# Patient Record
Sex: Female | Born: 1965 | Race: White | Hispanic: No | Marital: Married | Smoking: Former smoker
Health system: Southern US, Community
[De-identification: ages and names within clinical notes are randomized; demographics above are authoritative.]

## PROBLEM LIST (undated history)

## (undated) DIAGNOSIS — I10 Essential (primary) hypertension: Secondary | ICD-10-CM

## (undated) DIAGNOSIS — Z9981 Dependence on supplemental oxygen: Secondary | ICD-10-CM

## (undated) DIAGNOSIS — J439 Emphysema, unspecified: Secondary | ICD-10-CM

## (undated) HISTORY — PX: NASAL SEPTUM SURGERY: SHX37

## (undated) HISTORY — PX: TUBAL LIGATION: SHX77

## (undated) HISTORY — DX: Essential (primary) hypertension: I10

## (undated) HISTORY — PX: BLADDER SUSPENSION: SHX72

## (undated) HISTORY — PX: LUNG LOBECTOMY: SHX167

## (undated) HISTORY — PX: OTHER SURGICAL HISTORY: SHX169

## (undated) HISTORY — DX: Dependence on supplemental oxygen: Z99.81

## (undated) HISTORY — DX: Emphysema, unspecified: J43.9

---

## 2007-07-12 ENCOUNTER — Ambulatory Visit: Payer: Self-pay | Admitting: Internal Medicine

## 2007-07-12 DIAGNOSIS — E669 Obesity, unspecified: Secondary | ICD-10-CM

## 2007-07-12 DIAGNOSIS — J449 Chronic obstructive pulmonary disease, unspecified: Secondary | ICD-10-CM

## 2007-07-12 HISTORY — DX: Obesity, unspecified: E66.9

## 2007-07-12 HISTORY — DX: Chronic obstructive pulmonary disease, unspecified: J44.9

## 2007-07-19 ENCOUNTER — Telehealth (INDEPENDENT_AMBULATORY_CARE_PROVIDER_SITE_OTHER): Payer: Self-pay | Admitting: *Deleted

## 2007-08-10 ENCOUNTER — Ambulatory Visit: Payer: Self-pay | Admitting: Internal Medicine

## 2007-08-26 ENCOUNTER — Encounter: Payer: Self-pay | Admitting: Internal Medicine

## 2007-09-13 ENCOUNTER — Ambulatory Visit: Payer: Self-pay | Admitting: Cardiology

## 2007-09-14 ENCOUNTER — Ambulatory Visit: Payer: Self-pay

## 2007-09-14 ENCOUNTER — Encounter: Payer: Self-pay | Admitting: Cardiology

## 2007-09-21 ENCOUNTER — Ambulatory Visit: Payer: Self-pay | Admitting: Internal Medicine

## 2010-02-14 ENCOUNTER — Emergency Department (HOSPITAL_COMMUNITY): Admission: EM | Admit: 2010-02-14 | Discharge: 2010-02-14 | Payer: Self-pay | Admitting: Emergency Medicine

## 2010-08-21 LAB — URINALYSIS, ROUTINE W REFLEX MICROSCOPIC
Bilirubin Urine: NEGATIVE
Glucose, UA: NEGATIVE mg/dL
Hgb urine dipstick: NEGATIVE
Ketones, ur: NEGATIVE mg/dL
Nitrite: NEGATIVE
Protein, ur: NEGATIVE mg/dL
Specific Gravity, Urine: 1.004 — ABNORMAL LOW (ref 1.005–1.030)
Urobilinogen, UA: 0.2 mg/dL (ref 0.0–1.0)
pH: 7 (ref 5.0–8.0)

## 2010-10-21 NOTE — Assessment & Plan Note (Signed)
[facility]                            CARDIOLOGY OFFICE NOTE   TAMELLA, TUCCILLO                        MRN:          [id number]  DATE:09/13/2007                            DOB:          Apr 18, 1966    PRIMARY CARE PHYSICIAN:  Dr. Blane Ohara, [facility].   REASON FOR PRESENTATION:  Evaluate patient with abnormal echocardiogram  and dyspnea.   HISTORY OF PRESENT ILLNESS:  The patient is a pleasant, unfortunate 45-  year-old with apparently severe COPD.  She was followed by a  pulmonologist in [place].  More recently she has been seen at [facility].  Her lung disease is severe enough that they wanted to list her  transplant, but she has not yet consented to this.  She says they are  planning on doing a lung reduction surgery.  I do not have any of  these notes.  She is on O2 p.r.n., though she is supposed to wear it  more frequently.  She is dyspneic with mild exertion.  She recently had  an echocardiogram and was told this was abnormal and it was suggested  that she follow up with me.   The patient has not had any prior cardiac history.  She did have chest  pain and reports an exercise Cardiolite 2 years ago which she reports  was normal.  She has had EKGs to otherwise no other abnormalities noted.  She still gets occasional chest discomfort.  This has happened three  times in the last 4 months.  When it happens it is a discomfort that is  sharp and under her left breast.  It increases and decreases with  inspiration.  It radiates to her arm and lasts continuously for 2-3 days  before going away.  It goes away spontaneously.  She does not have any  associated nausea, vomiting or diaphoresis.  It occurs without  provocation.   PAST MEDICAL HISTORY:  1. Diabetes mellitus, recently diagnosed and thought to be related to      her frequent prednisone use.  2. Hyperlipidemia x1 month.   PAST SURGICAL HISTORY:  Tubal ligation.   ALLERGIES/INTOLERANCES:  None.   MEDICATIONS:  Alprazolam, Symbicort, Crestor 10 mg daily, Astelin,  Nasonex.   SOCIAL HISTORY:  The patient is a housewife.  She is married.  She has  six children with two sets of twins.  She quit smoking after about 20  years.  She has not smoked since March 2002.   FAMILY HISTORY:  Contributory for her father having severe emphysema and  her brother dying with emphysema.  She has two sisters with arrhythmias  but she does not know more details about this.  She has another sister  who sounds like she has a bicuspid aortic valve.  Her father did have an  MI in his 25s apparently.   REVIEW OF SYSTEMS:  As stated in the HPI and is positive headaches and  dizziness, reflux, swelling in her feet and ankles.  Negative for other  systems.   PHYSICAL EXAMINATION:  The patient is in no acute  distress.  Blood pressure 114/82, heart rate 80 and regular, weight 173 pounds.  HEENT:  Eyelids unremarkable.  Pupils equal, round and react to light,  fundi not visualized, oral mucosa unremarkable.  NECK:  No jugular venous distention at 45 degrees.  Carotid upstroke  brisk and symmetric.  No bruits, no thyromegaly.  LYMPHATICS:  No cervical, axillary or inguinal adenopathy.  LUNGS:  Decreased breath sounds without wheezing or crackles, no  dullness to percussion.  BACK:  No costovertebral angle mass.  CHEST:  Unremarkable.  HEART:  PMI not displaced or sustained, distant heart sounds, S1 and S2  within normal.  No S3, no S4, no clicks, no rubs, no murmurs.  ABDOMEN:  Flat, positive bowel sounds, normal in frequency and pitch, no  bruits, no rebound, no guarding, no midline pulsatile mass.  No  hepatomegaly, no splenomegaly.  SKIN:  No rashes, no nodules.  EXTREMITIES:  2+ pulse throughout, no edema, no cyanosis, no clubbing.  NEUROLOGIC:  Oriented to person place and time.  Cranial nerves II-XII  grossly intact.  Motor grossly intact.   EKG:  Sinus rhythm,  rate 80, axis within normal limits, intervals within  normal limits, no acute ST-T wave change.   1. Echocardiogram:  I tried to review an echocardiogram that she      brought on disk.  I was able to load this but not into a format      that was easy to visualize.  In addition, the images were very      difficult to evaluate.  There were poor acoustic windows.  I will      try to review this further and see if we can get it set up on a      different machine.  However, I think it is most prudent to perform      a repeat echocardiogram here.  I discussed this with the patient      and she agrees to proceed.  Further evaluation will be based on      these results.  2. Chest pain:  The patient's chest pain is very atypical.  Though she      does have significant risk factors, she had a negative stress      perfusion study a couple of years ago.  Certainly if there is any      regional wall motion abnormality I will consider further testing.      However, at this point I would suggest this is probably related to      her lung disease and would not suggest further imaging unless this      changes.  3. Hyperlipidemia:  Per her primary care physician.  4. Follow-up will be based on the results of the echocardiogram..     Rollene Rotunda, MD, [facility]  Electronically Signed    JH/MedQ  DD: 09/13/2007  DT: 09/14/2007  Job #: [id number]   cc:   Blane Ohara, MD

## 2012-11-28 DIAGNOSIS — K5909 Other constipation: Secondary | ICD-10-CM

## 2012-11-28 DIAGNOSIS — E663 Overweight: Secondary | ICD-10-CM

## 2012-11-28 DIAGNOSIS — J9610 Chronic respiratory failure, unspecified whether with hypoxia or hypercapnia: Secondary | ICD-10-CM

## 2012-11-28 DIAGNOSIS — R0789 Other chest pain: Secondary | ICD-10-CM

## 2012-11-28 DIAGNOSIS — J029 Acute pharyngitis, unspecified: Secondary | ICD-10-CM

## 2012-11-28 DIAGNOSIS — Z8249 Family history of ischemic heart disease and other diseases of the circulatory system: Secondary | ICD-10-CM

## 2012-11-28 DIAGNOSIS — F419 Anxiety disorder, unspecified: Secondary | ICD-10-CM

## 2012-11-28 HISTORY — DX: Other constipation: K59.09

## 2012-11-28 HISTORY — DX: Family history of ischemic heart disease and other diseases of the circulatory system: Z82.49

## 2012-11-28 HISTORY — DX: Overweight: E66.3

## 2012-11-28 HISTORY — DX: Anxiety disorder, unspecified: F41.9

## 2012-11-28 HISTORY — DX: Acute pharyngitis, unspecified: J02.9

## 2012-11-28 HISTORY — DX: Other chest pain: R07.89

## 2012-11-28 HISTORY — DX: Chronic respiratory failure, unspecified whether with hypoxia or hypercapnia: J96.10

## 2013-04-26 DIAGNOSIS — R942 Abnormal results of pulmonary function studies: Secondary | ICD-10-CM

## 2013-04-26 HISTORY — DX: Abnormal results of pulmonary function studies: R94.2

## 2015-07-08 DIAGNOSIS — D649 Anemia, unspecified: Secondary | ICD-10-CM

## 2015-07-08 DIAGNOSIS — D72829 Elevated white blood cell count, unspecified: Secondary | ICD-10-CM

## 2015-07-08 DIAGNOSIS — Z7952 Long term (current) use of systemic steroids: Secondary | ICD-10-CM

## 2015-07-16 DIAGNOSIS — D649 Anemia, unspecified: Secondary | ICD-10-CM | POA: Diagnosis not present

## 2015-07-16 DIAGNOSIS — D72829 Elevated white blood cell count, unspecified: Secondary | ICD-10-CM | POA: Diagnosis not present

## 2015-09-16 DIAGNOSIS — D509 Iron deficiency anemia, unspecified: Secondary | ICD-10-CM | POA: Diagnosis not present

## 2016-03-20 DIAGNOSIS — Z862 Personal history of diseases of the blood and blood-forming organs and certain disorders involving the immune mechanism: Secondary | ICD-10-CM | POA: Diagnosis not present

## 2017-06-09 DIAGNOSIS — R0600 Dyspnea, unspecified: Secondary | ICD-10-CM | POA: Diagnosis not present

## 2017-08-31 DIAGNOSIS — E785 Hyperlipidemia, unspecified: Secondary | ICD-10-CM

## 2017-08-31 DIAGNOSIS — Z9981 Dependence on supplemental oxygen: Secondary | ICD-10-CM

## 2017-08-31 DIAGNOSIS — R000 Tachycardia, unspecified: Secondary | ICD-10-CM

## 2017-08-31 DIAGNOSIS — J439 Emphysema, unspecified: Secondary | ICD-10-CM | POA: Insufficient documentation

## 2017-08-31 DIAGNOSIS — I10 Essential (primary) hypertension: Secondary | ICD-10-CM | POA: Insufficient documentation

## 2017-08-31 DIAGNOSIS — R002 Palpitations: Secondary | ICD-10-CM

## 2017-08-31 HISTORY — DX: Palpitations: R00.2

## 2017-08-31 HISTORY — DX: Hyperlipidemia, unspecified: E78.5

## 2017-08-31 HISTORY — DX: Dependence on supplemental oxygen: Z99.81

## 2017-08-31 HISTORY — DX: Tachycardia, unspecified: R00.0

## 2019-04-25 ENCOUNTER — Other Ambulatory Visit: Payer: Self-pay

## 2019-04-25 ENCOUNTER — Encounter: Payer: Self-pay | Admitting: Cardiology

## 2019-04-25 ENCOUNTER — Ambulatory Visit (INDEPENDENT_AMBULATORY_CARE_PROVIDER_SITE_OTHER): Payer: Medicare Other | Admitting: Cardiology

## 2019-04-25 VITALS — BP 116/80 | HR 102 | Ht 65.0 in | Wt 200.0 lb

## 2019-04-25 DIAGNOSIS — R000 Tachycardia, unspecified: Secondary | ICD-10-CM | POA: Diagnosis not present

## 2019-04-25 DIAGNOSIS — Z01812 Encounter for preprocedural laboratory examination: Secondary | ICD-10-CM

## 2019-04-25 DIAGNOSIS — R0602 Shortness of breath: Secondary | ICD-10-CM

## 2019-04-25 DIAGNOSIS — I10 Essential (primary) hypertension: Secondary | ICD-10-CM | POA: Diagnosis not present

## 2019-04-25 DIAGNOSIS — E782 Mixed hyperlipidemia: Secondary | ICD-10-CM

## 2019-04-25 DIAGNOSIS — E669 Obesity, unspecified: Secondary | ICD-10-CM

## 2019-04-25 MED ORDER — FUROSEMIDE 20 MG PO TABS: 20.0000 mg | ORAL_TABLET | Freq: Every day | ORAL | 1 refills | Status: DC

## 2019-04-25 MED ORDER — POTASSIUM CHLORIDE CRYS ER 20 MEQ PO TBCR: 20.0000 meq | EXTENDED_RELEASE_TABLET | Freq: Every day | ORAL | 1 refills | Status: DC

## 2019-04-25 NOTE — Patient Instructions (Addendum)
Medication Instructions:  Your physician has recommended you make the following change in your medication:   START : Furosemide 20 mg Take 1 tab daily START: Potassium 20 meq Take 1 tab daily  *If you need a refill on your cardiac medications before your next appointment, please call your pharmacy*  Lab Work: Your physician recommends that you return for lab work in: TODAY BMP,Magnesium,Lipid  3-7 days prior to CT:BMP  If you have labs (blood work) drawn today and your tests are completely normal, you will receive your results only by: Marland Kitchen MyChart Message (if you have MyChart) OR . A paper copy in the mail If you have any lab test that is abnormal or we need to change your treatment, we will call you to review the results.  Testing/Procedures: Your physician has requested that you have an echocardiogram. Echocardiography is a painless test that uses sound waves to create images of your heart. It provides your doctor with information about the size and shape of your heart and how well your heart's chambers and valves are working. This procedure takes approximately one hour. There are no restrictions for this procedure.  Your physician has requested that you have cardiac CT. Cardiac computed tomography (CT) is a painless test that uses an x-ray machine to take clear, detailed pictures of your heart. For further information please visit [email or web address]. Please follow instruction sheet as given.   Your cardiac CT will be scheduled at one of the below locations:   [facility] [address] [place], [place] [address] [phone]   If scheduled at [facility], please arrive at the [facility] main entrance of [facility] 30-45 minutes prior to test start time. Proceed to the [facility] Radiology Department (first floor) to check-in and test prep.  Please follow these instructions carefully (unless otherwise directed):  Hold all erectile dysfunction  medications at least 3 days (72 hrs) prior to test.  On the Night Before the Test: . Be sure to Drink plenty of water. . Do not consume any caffeinated/decaffeinated beverages or chocolate 12 hours prior to your test. . Do not take any antihistamines 12 hours prior to your test.  On the Day of the Test: . Drink plenty of water. Do not drink any water within one hour of the test. . Do not eat any food 4 hours prior to the test. . You may take your regular medications prior to the test.  . Take metoprolol (TOPROL) two hours prior to test. . HOLD Furosemide morning of the test. . FEMALES- please wear underwire-free bra if available   After the Test: . Drink plenty of water. . After receiving IV contrast, you may experience a mild flushed feeling. This is normal. . On occasion, you may experience a mild rash up to 24 hours after the test. This is not dangerous. If this occurs, you can take Benadryl 25 mg and increase your fluid intake. . If you experience trouble breathing, this can be serious. If it is severe call 911 IMMEDIATELY. If it is mild, please call our office.    Once we have confirmed authorization from your insurance company, we will call you to set up a date and time for your test.   For non-scheduling related questions, please contact the cardiac imaging nurse navigator should you have any questions/concerns: Marchia Bond, RN Navigator Cardiac Imaging [facility] [phone] Office    Follow-Up: At [facility], you and your health  needs are our priority.  As part of our continuing mission to provide you with exceptional heart care, we have created designated Provider Care Teams.  These Care Teams include your primary Cardiologist (physician) and Advanced Practice Providers (APPs -  Physician Assistants and Nurse Practitioners) who all work together to provide you with the care you need, when you need it.  Your next appointment:   3 months   The format for your next appointment:   In Person  Provider:   Berniece Salines, DO  Other Instructions  Cardiac CT Angiogram  A cardiac CT angiogram is a procedure to look at the heart and the area around the heart. It may be done to help find the cause of chest pains or other symptoms of heart disease. During this procedure, a large X-ray machine, called a CT scanner, takes detailed pictures of the heart and the surrounding area after a dye (contrast material) has been injected into blood vessels in the area. The procedure is also sometimes called a coronary CT angiogram, coronary artery scanning, or CTA. A cardiac CT angiogram allows the health care provider to see how well blood is flowing to and from the heart. The health care provider will be able to see if there are any problems, such as:  Blockage or narrowing of the coronary arteries in the heart.  Fluid around the heart.  Signs of weakness or disease in the muscles, valves, and tissues of the heart. Tell a health care provider about:  Any allergies you have. This is especially important if you have had a previous allergic reaction to contrast dye.  All medicines you are taking, including vitamins, herbs, eye drops, creams, and over-the-counter medicines.  Any blood disorders you have.  Any surgeries you have had.  Any medical conditions you have.  Whether you are pregnant or may be pregnant.  Any anxiety disorders, chronic pain, or other conditions you have that may increase your stress or prevent you from lying still. What are the risks? Generally, this is a safe procedure. However, problems may occur, including:  Bleeding.  Infection.  Allergic reactions to medicines or dyes.  Damage to other structures or organs.  Kidney damage from the dye or contrast that is used.  Increased risk of cancer from radiation exposure. This risk is low. Talk with your health care provider about: ? The risks and benefits of  testing. ? How you can receive the lowest dose of radiation. What happens before the procedure?  Wear comfortable clothing and remove any jewelry, glasses, dentures, and hearing aids.  Follow instructions from your health care provider about eating and drinking. This may include: ? For 12 hours before the test - avoid caffeine. This includes tea, coffee, soda, energy drinks, and diet pills. Drink plenty of water or other fluids that do not have caffeine in them. Being well-hydrated can prevent complications. ? For 4-6 hours before the test - stop eating and drinking. The contrast dye can cause nausea, but this is less likely if your stomach is empty.  Ask your health care provider about changing or stopping your regular medicines. This is especially important if you are taking diabetes medicines, blood thinners, or medicines to treat erectile dysfunction. What happens during the procedure?  Hair on your chest may need to be removed so that small sticky patches called electrodes can be placed on your chest. These will transmit information that helps to monitor your heart during the test.  An IV tube will be  inserted into one of your veins.  You might be given a medicine to control your heart rate during the test. This will help to ensure that good images are obtained.  You will be asked to lie on an exam table. This table will slide in and out of the CT machine during the procedure.  Contrast dye will be injected into the IV tube. You might feel warm, or you may get a metallic taste in your mouth.  You will be given a medicine (nitroglycerin) to relax (dilate) the arteries in your heart.  The table that you are lying on will move into the CT machine tunnel for the scan.  The person running the machine will give you instructions while the scans are being done. You may be asked to: ? Keep your arms above your head. ? Hold your breath. ? Stay very still, even if the table is moving.  When  the scanning is complete, you will be moved out of the machine.  The IV tube will be removed. The procedure may vary among health care providers and hospitals. What happens after the procedure?  You might feel warm, or you may get a metallic taste in your mouth from the contrast dye.  You may have a headache from the nitroglycerin.  After the procedure, drink water or other fluids to wash (flush) the contrast material out of your body.  Contact a health care provider if you have any symptoms of allergy to the contrast. These symptoms include: ? Shortness of breath. ? Rash or hives. ? A racing heartbeat.  Most people can return to their normal activities right after the procedure. Ask your health care provider what activities are safe for you.  It is up to you to get the results of your procedure. Ask your health care provider, or the department that is doing the procedure, when your results will be ready. Summary  A cardiac CT angiogram is a procedure to look at the heart and the area around the heart. It may be done to help find the cause of chest pains or other symptoms of heart disease.  During this procedure, a large X-ray machine, called a CT scanner, takes detailed pictures of the heart and the surrounding area after a dye (contrast material) has been injected into blood vessels in the area.  Ask your health care provider about changing or stopping your regular medicines before the procedure. This is especially important if you are taking diabetes medicines, blood thinners, or medicines to treat erectile dysfunction.  After the procedure, drink water or other fluids to wash (flush) the contrast material out of your body. This information is not intended to replace advice given to you by your health care provider. Make sure you discuss any questions you have with your health care provider. Document Released: 05/07/2008 Document Revised: 05/07/2017 Document Reviewed: 04/13/2016  Elsevier Patient Education  2020 [employer].

## 2019-04-25 NOTE — Progress Notes (Signed)
Cardiology Office Note:    Date:  04/25/2019   ID:  Carol Cummings, DOB Nov 12, 1965, MRN [id number]  PCP:  [facility]  Cardiologist:  Berniece Salines, DO  Electrophysiologist:  None   Referring MD: Welford Roche, NP   The patient is referred by her primary care physician for shortness of breath.  History of Present Illness:    Carol Cummings is a 53 y.o. female with a hx of hypertension, hyperlipidemia, chronic respiratory failure due to COPD on 4 L cannula.  The patient tells me over the last several months she has been having significant shortness of breath on exertion.  She notes that at first she was not taking her Lasix therefore her PCP reorder her Lasix 20 mg daily.  She has been taking it this shortness of breath seems to have helped but still is progressive.  This is different from her baseline as she does have some shortness of breath on exertion from her baseline.   She does not report any chest pain, lightheadedness or dizziness.  Past Medical History:  Diagnosis Date  . Emphysema lung ([place])   . Hypertension   . Oxygen dependent    2-6 L    Past Surgical History:  Procedure Laterality Date  . BLADDER SUSPENSION    . large intestine surgery    . LUNG LOBECTOMY     both upper lobes  . NASAL SEPTUM SURGERY    . TUBAL LIGATION      Current Medications: Current Meds  Medication Sig  . albuterol (PROAIR HFA) 108 (90 Base) MCG/ACT inhaler Inhale into the lungs 3 (three) times daily as needed.  . ALPRAZolam (XANAX) 0.5 MG tablet Take by mouth 2 (two) times daily.  Marland Kitchen azelastine (ASTELIN) 0.1 % nasal spray 2 (two) times daily.  Marland Kitchen buPROPion (WELLBUTRIN SR) 150 MG 12 hr tablet Take by mouth 2 (two) times daily.  . Fluticasone-Umeclidin-Vilant 100-62.5-25 MCG/INH AEPB daily.  . furosemide (LASIX) 20 MG tablet Take 1 tablet (20 mg total) by mouth daily.  Marland Kitchen guaiFENesin (MUCINEX) 600 MG 12 hr tablet Take by mouth every 12 (twelve)  hours.  Marland Kitchen ibuprofen (ADVIL) 200 MG tablet Take by mouth.  Marland Kitchen ipratropium-albuterol (DUONEB) 0.5-2.5 (3) MG/3ML SOLN 4 (four) times daily.  . metoprolol tartrate (LOPRESSOR) 25 MG tablet Take by mouth 2 (two) times daily.  . pantoprazole (PROTONIX) 40 MG tablet Take by mouth daily.  . promethazine (PHENERGAN) 12.5 MG tablet Take by mouth every 6 (six) hours as needed.  . rosuvastatin (CRESTOR) 10 MG tablet Take by mouth daily.  . [DISCONTINUED] furosemide (LASIX) 20 MG tablet Take by mouth daily.     Allergies:   Topiramate and Phentermine   Social History   Socioeconomic History  . Marital status: Married    Spouse name: Not on file  . Number of children: Not on file  . Years of education: Not on file  . Highest education level: Not on file  Occupational History  . Not on file  Social Needs  . Financial resource strain: Not on file  . Food insecurity    Worry: Not on file    Inability: Not on file  . Transportation needs    Medical: Not on file    Non-medical: Not on file  Tobacco Use  . Smoking status: Former Research scientist (life sciences)  . Smokeless tobacco: Never Used  Substance and Sexual Activity  . Alcohol use: Not Currently  . Drug use: Never  .  Sexual activity: Not on file  Lifestyle  . Physical activity    Days per week: Not on file    Minutes per session: Not on file  . Stress: Not on file  Relationships  . Social Herbalist on phone: Not on file    Gets together: Not on file    Attends religious service: Not on file    Active member of club or organization: Not on file    Attends meetings of clubs or organizations: Not on file    Relationship status: Not on file  Other Topics Concern  . Not on file  Social History Narrative  . Not on file     Family History: The patient's family history includes Aneurysm in her sister; Diabetes in her mother; Emphysema in her brother and father; Heart attack in her father; Hyperlipidemia in her mother; Hypertension in her  mother; Stomach cancer in her mother; Stroke in her mother.  ROS:   Review of Systems  Constitution: Negative for decreased appetite, fever and weight gain.  HENT: Negative for congestion, ear discharge, hoarse voice and sore throat.   Eyes: Negative for discharge, redness, vision loss in right eye and visual halos.  Cardiovascular: Reports shortness of breath.  Negative for chest pain, leg swelling, orthopnea and palpitations.  Respiratory: Negative for cough, hemoptysis, shortness of breath and snoring.   Endocrine: Negative for heat intolerance and polyphagia.  Hematologic/Lymphatic: Negative for bleeding problem. Does not bruise/bleed easily.  Skin: Negative for flushing, nail changes, rash and suspicious lesions.  Musculoskeletal: Negative for arthritis, joint pain, muscle cramps, myalgias, neck pain and stiffness.  Gastrointestinal: Negative for abdominal pain, bowel incontinence, diarrhea and excessive appetite.  Genitourinary: Negative for decreased libido, genital sores and incomplete emptying.  Neurological: Negative for brief paralysis, focal weakness, headaches and loss of balance.  Psychiatric/Behavioral: Negative for altered mental status, depression and suicidal ideas.  Allergic/Immunologic: Negative for HIV exposure and persistent infections.    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  No prior EKG for comparison.  Recent Labs: No results found for requested labs within last 8760 hours.  Recent Lipid Panel No results found for: CHOL, TRIG, HDL, CHOLHDL, VLDL, LDLCALC, LDLDIRECT  Physical Exam:    VS:  BP 116/80 (BP Location: Left Arm)   Pulse (!) 102   Ht 5\' 5"  (1.651 m)   Wt 200 lb (90.7 kg)   SpO2 100%   BMI 33.28 kg/m     Wt Readings from Last 3 Encounters:  04/25/19 200  lb (90.7 kg)     GEN: Very pleasant lady, wearing oxygen through nostrils well nourished, well developed in no acute distress HEENT: Normal NECK: No JVD; No carotid bruits LYMPHATICS: No lymphadenopathy CARDIAC: S1S2 noted,RRR, no murmurs, rubs, gallops RESPIRATORY:  Clear to auscultation without rales, wheezing or rhonchi  ABDOMEN: Soft, non-tender, non-distended, +bowel sounds, no guarding. EXTREMITIES: No edema, No cyanosis, no clubbing MUSCULOSKELETAL:  No edema; No deformity  SKIN: Warm and dry NEUROLOGIC:  Alert and oriented x 3, non-focal PSYCHIATRIC:  Normal affect, good insight  ASSESSMENT:    1. Shortness of breath   2. Pre-procedure lab exam   3. Hypertension, unspecified type   4. Sinus tachycardia   5. Mixed hyperlipidemia   6. Obesity (BMI 30-39.9)  PLAN:    1.  Her shortness of breath is concerning for as this is different from her baseline shortness of breath.  Therefore I am concerned that this is her anginal equivalent. Giving her risk factors we will proceed with ischemic evaluation.  The patient is not allergic to IV contrast dye, she has been educated about CTA coronaries and will proceed with this testing.  In addition transthoracic echocardiogram will be ordered to assess RV/LV function and for any other structural abnormalities.  2.  Sinus tachycardia in the office today, she is on metoprolol tartrate 25 mg twice daily.  We will continue this for now. I suspect she in anxious in the office today.  3.  Hyperlipidemia continue patient on her Crestor 40 mg daily.  4.  Obesity-the patient understands the need to lose weight with diet and exercise. We have discussed specific strategies for this.  The patient is in agreement with the above plan. The patient left the office in stable condition.  The patient will follow up in 3 months or sooner if needed.    Medication Adjustments/Labs and Tests Ordered: Current medicines are reviewed at length with the  patient today.  Concerns regarding medicines are outlined above.  Orders Placed This Encounter  Procedures  . CT CORONARY FRACTIONAL FLOW RESERVE DATA PREP  . CT CORONARY FRACTIONAL FLOW RESERVE FLUID ANALYSIS  . CT CORONARY MORPH W/CTA COR W/SCORE W/CA W/CM &/OR WO/CM  . Basic Metabolic Panel (BMET)  . Basic Metabolic Panel (BMET)  . Magnesium  . Lipid Profile  . ECHOCARDIOGRAM COMPLETE   Meds ordered this encounter  Medications  . furosemide (LASIX) 20 MG tablet    Sig: Take 1 tablet (20 mg total) by mouth daily.    Dispense:  90 tablet    Refill:  1  . potassium chloride SA (KLOR-CON M20) 20 MEQ tablet    Sig: Take 1 tablet (20 mEq total) by mouth daily.    Dispense:  90 tablet    Refill:  1    Patient Instructions  Medication Instructions:  Your physician has recommended you make the following change in your medication:   START : Furosemide 20 mg Take 1 tab daily START: Potassium 20 meq Take 1 tab daily  *If you need a refill on your cardiac medications before your next appointment, please call your pharmacy*  Lab Work: Your physician recommends that you return for lab work in: TODAY BMP,Magnesium,Lipid  3-7 days prior to CT:BMP  If you have labs (blood work) drawn today and your tests are completely normal, you will receive your results only by: Marland Kitchen MyChart Message (if you have MyChart) OR . A paper copy in the mail If you have any lab test that is abnormal or we need to change your treatment, we will call you to review the results.  Testing/Procedures: Your physician has requested that you have an echocardiogram. Echocardiography is a painless test that uses sound waves to create images of your heart. It provides your doctor with information about the size and shape of your heart and how well your heart's chambers and valves are working. This procedure takes approximately one hour. There are no restrictions for this procedure.  Your physician has requested that you  have cardiac CT. Cardiac computed tomography (CT) is a painless test that uses an x-ray machine to take clear, detailed pictures of your heart. For further information please visit [email or web address]tn. Please follow instruction sheet as given.   Your cardiac CT will  be scheduled at one of the below locations:   [facility] [address] [place], [place] [address] [phone]   If scheduled at [facility], please arrive at the [facility] main entrance of [facility] 30-45 minutes prior to test start time. Proceed to the [facility] Radiology Department (first floor) to check-in and test prep.  Please follow these instructions carefully (unless otherwise directed):  Hold all erectile dysfunction medications at least 3 days (72 hrs) prior to test.  On the Night Before the Test: . Be sure to Drink plenty of water. . Do not consume any caffeinated/decaffeinated beverages or chocolate 12 hours prior to your test. . Do not take any antihistamines 12 hours prior to your test.  On the Day of the Test: . Drink plenty of water. Do not drink any water within one hour of the test. . Do not eat any food 4 hours prior to the test. . You may take your regular medications prior to the test.  . Take metoprolol (TOPROL) two hours prior to test. . HOLD Furosemide morning of the test. . FEMALES- please wear underwire-free bra if available   After the Test: . Drink plenty of water. . After receiving IV contrast, you may experience a mild flushed feeling. This is normal. . On occasion, you may experience a mild rash up to 24 hours after the test. This is not dangerous. If this occurs, you can take Benadryl 25 mg and increase your fluid intake. . If you experience trouble breathing, this can be serious. If it is severe call 911 IMMEDIATELY. If it is mild, please call our office.    Once we have confirmed authorization from your insurance company, we will call you to  set up a date and time for your test.   For non-scheduling related questions, please contact the cardiac imaging nurse navigator should you have any questions/concerns: Marchia Bond, RN Navigator Cardiac Imaging [facility] [phone] Office    Follow-Up: At [facility], you and your health needs are our priority.  As part of our continuing mission to provide you with exceptional heart care, we have created designated Provider Care Teams.  These Care Teams include your primary Cardiologist (physician) and Advanced Practice Providers (APPs -  Physician Assistants and Nurse Practitioners) who all work together to provide you with the care you need, when you need it.  Your next appointment:   3 months  The format for your next appointment:   In Person  Provider:   Berniece Salines, DO  Other Instructions  Cardiac CT Angiogram  A cardiac CT angiogram is a procedure to look at the heart and the area around the heart. It may be done to help find the cause of chest pains or other symptoms of heart disease. During this procedure, a large X-ray machine, called a CT scanner, takes detailed pictures of the heart and the surrounding area after a dye (contrast material) has been injected into blood vessels in the area. The procedure is also sometimes called a coronary CT angiogram, coronary artery scanning, or CTA. A cardiac CT angiogram allows the health care provider to see how well blood is flowing to and from the heart. The health care provider will be able to see if there are any problems, such as:  Blockage or narrowing of the coronary arteries in the heart.  Fluid around the heart.  Signs of weakness or disease in the muscles, valves, and  tissues of the heart. Tell a health care provider about:  Any allergies you have. This is especially important if you have had a previous allergic reaction to contrast dye.  All medicines you are taking, including vitamins,  herbs, eye drops, creams, and over-the-counter medicines.  Any blood disorders you have.  Any surgeries you have had.  Any medical conditions you have.  Whether you are pregnant or may be pregnant.  Any anxiety disorders, chronic pain, or other conditions you have that may increase your stress or prevent you from lying still. What are the risks? Generally, this is a safe procedure. However, problems may occur, including:  Bleeding.  Infection.  Allergic reactions to medicines or dyes.  Damage to other structures or organs.  Kidney damage from the dye or contrast that is used.  Increased risk of cancer from radiation exposure. This risk is low. Talk with your health care provider about: ? The risks and benefits of testing. ? How you can receive the lowest dose of radiation. What happens before the procedure?  Wear comfortable clothing and remove any jewelry, glasses, dentures, and hearing aids.  Follow instructions from your health care provider about eating and drinking. This may include: ? For 12 hours before the test - avoid caffeine. This includes tea, coffee, soda, energy drinks, and diet pills. Drink plenty of water or other fluids that do not have caffeine in them. Being well-hydrated can prevent complications. ? For 4-6 hours before the test - stop eating and drinking. The contrast dye can cause nausea, but this is less likely if your stomach is empty.  Ask your health care provider about changing or stopping your regular medicines. This is especially important if you are taking diabetes medicines, blood thinners, or medicines to treat erectile dysfunction. What happens during the procedure?  Hair on your chest may need to be removed so that small sticky patches called electrodes can be placed on your chest. These will transmit information that helps to monitor your heart during the test.  An IV tube will be inserted into one of your veins.  You might be given a  medicine to control your heart rate during the test. This will help to ensure that good images are obtained.  You will be asked to lie on an exam table. This table will slide in and out of the CT machine during the procedure.  Contrast dye will be injected into the IV tube. You might feel warm, or you may get a metallic taste in your mouth.  You will be given a medicine (nitroglycerin) to relax (dilate) the arteries in your heart.  The table that you are lying on will move into the CT machine tunnel for the scan.  The person running the machine will give you instructions while the scans are being done. You may be asked to: ? Keep your arms above your head. ? Hold your breath. ? Stay very still, even if the table is moving.  When the scanning is complete, you will be moved out of the machine.  The IV tube will be removed. The procedure may vary among health care providers and hospitals. What happens after the procedure?  You might feel warm, or you may get a metallic taste in your mouth from the contrast dye.  You may have a headache from the nitroglycerin.  After the procedure, drink water or other fluids to wash (flush) the contrast material out of your body.  Contact a health care provider if  you have any symptoms of allergy to the contrast. These symptoms include: ? Shortness of breath. ? Rash or hives. ? A racing heartbeat.  Most people can return to their normal activities right after the procedure. Ask your health care provider what activities are safe for you.  It is up to you to get the results of your procedure. Ask your health care provider, or the department that is doing the procedure, when your results will be ready. Summary  A cardiac CT angiogram is a procedure to look at the heart and the area around the heart. It may be done to help find the cause of chest pains or other symptoms of heart disease.  During this procedure, a large X-ray machine, called a CT  scanner, takes detailed pictures of the heart and the surrounding area after a dye (contrast material) has been injected into blood vessels in the area.  Ask your health care provider about changing or stopping your regular medicines before the procedure. This is especially important if you are taking diabetes medicines, blood thinners, or medicines to treat erectile dysfunction.  After the procedure, drink water or other fluids to wash (flush) the contrast material out of your body. This information is not intended to replace advice given to you by your health care provider. Make sure you discuss any questions you have with your health care provider. Document Released: 05/07/2008 Document Revised: 05/07/2017 Document Reviewed: 04/13/2016 Elsevier Patient Education  2020 [employer].      Adopting a Healthy Lifestyle.  Know what a healthy weight is for you (roughly BMI <25) and aim to maintain this   Aim for 7+ servings of fruits and vegetables daily   65-80+ fluid ounces of water or unsweet tea for healthy kidneys   Limit to max 1 drink of alcohol per day; avoid smoking/tobacco   Limit animal fats in diet for cholesterol and heart health - choose grass fed whenever available   Avoid highly processed foods, and foods high in saturated/trans fats   Aim for low stress - take time to unwind and care for your mental health   Aim for 150 min of moderate intensity exercise weekly for heart health, and weights twice weekly for bone health   Aim for 7-9 hours of sleep daily   When it comes to diets, agreement about the perfect plan isnt easy to find, even among the experts. Experts at the [employer] developed an idea known as the Healthy Eating Plate. Just imagine a plate divided into logical, healthy portions.   The emphasis is on diet quality:   Load up on vegetables and fruits - one-half of your plate: Aim for color and variety, and remember that potatoes dont  count.   Go for whole grains - one-quarter of your plate: Whole wheat, barley, wheat berries, quinoa, oats, brown rice, and foods made with them. If you want pasta, go with whole wheat pasta.   Protein power - one-quarter of your plate: Fish, chicken, beans, and nuts are all healthy, versatile protein sources. Limit red meat.   The diet, however, does go beyond the plate, offering a few other suggestions.   Use healthy plant oils, such as olive, canola, soy, corn, sunflower and peanut. Check the labels, and avoid partially hydrogenated oil, which have unhealthy trans fats.   If youre thirsty, drink water. Coffee and tea are good in moderation, but skip sugary drinks and limit milk and dairy products to one or two  daily servings.   The type of carbohydrate in the diet is more important than the amount. Some sources of carbohydrates, such as vegetables, fruits, whole grains, and beans-are healthier than others.   Finally, stay active  Signed, Berniece Salines, DO  04/25/2019 10:07 AM    [place]

## 2019-04-26 LAB — BASIC METABOLIC PANEL
BUN/Creatinine Ratio: 10 (ref 9–23)
BUN: 10 mg/dL (ref 6–24)
CO2: 26 mmol/L (ref 20–29)
Calcium: 9.8 mg/dL (ref 8.7–10.2)
Chloride: 100 mmol/L (ref 96–106)
Creatinine, Ser: 0.99 mg/dL (ref 0.57–1.00)
GFR calc Af Amer: 76 mL/min/{1.73_m2} (ref >59)
GFR calc non Af Amer: 66 mL/min/{1.73_m2} (ref >59)
Glucose: 104 mg/dL — ABNORMAL HIGH (ref 65–99)
Potassium: 4.2 mmol/L (ref 3.5–5.2)
Sodium: 143 mmol/L (ref 134–144)

## 2019-04-26 LAB — LIPID PANEL
Chol/HDL Ratio: 3.1 ratio (ref 0.0–4.4)
Cholesterol, Total: 170 mg/dL (ref 100–199)
HDL: 54 mg/dL (ref >39)
LDL Chol Calc (NIH): 91 mg/dL (ref 0–99)
Triglycerides: 142 mg/dL (ref 0–149)
VLDL Cholesterol Cal: 25 mg/dL (ref 5–40)

## 2019-04-26 LAB — MAGNESIUM: Magnesium: 1.8 mg/dL (ref 1.6–2.3)

## 2019-04-27 ENCOUNTER — Telehealth: Payer: Self-pay | Admitting: *Deleted

## 2019-04-27 NOTE — Telephone Encounter (Signed)
Telephone call to patient. Left message that labs were normal and to call with any questions. 

## 2019-04-27 NOTE — Telephone Encounter (Signed)
-----   Message from Berniece Salines, DO sent at 04/26/2019  9:16 PM EST ----- Labs with slightly elevated glucose- otherwise normal.

## 2019-06-19 ENCOUNTER — Other Ambulatory Visit: Payer: Self-pay

## 2019-06-19 ENCOUNTER — Ambulatory Visit (INDEPENDENT_AMBULATORY_CARE_PROVIDER_SITE_OTHER): Payer: Medicare Other

## 2019-06-19 DIAGNOSIS — R0602 Shortness of breath: Secondary | ICD-10-CM

## 2019-06-19 NOTE — Progress Notes (Signed)
Complete echocardiogram has been performed.  Jimmy Erine Phenix RDCS, RVT 

## 2019-07-26 ENCOUNTER — Ambulatory Visit (INDEPENDENT_AMBULATORY_CARE_PROVIDER_SITE_OTHER): Payer: Medicare Other | Admitting: Cardiology

## 2019-07-26 ENCOUNTER — Encounter: Payer: Self-pay | Admitting: Cardiology

## 2019-07-26 ENCOUNTER — Other Ambulatory Visit: Payer: Self-pay

## 2019-07-26 VITALS — BP 110/86 | HR 50 | Ht 65.0 in | Wt 201.0 lb

## 2019-07-26 DIAGNOSIS — R600 Localized edema: Secondary | ICD-10-CM

## 2019-07-26 DIAGNOSIS — E782 Mixed hyperlipidemia: Secondary | ICD-10-CM

## 2019-07-26 DIAGNOSIS — R0602 Shortness of breath: Secondary | ICD-10-CM

## 2019-07-26 DIAGNOSIS — R001 Bradycardia, unspecified: Secondary | ICD-10-CM

## 2019-07-26 HISTORY — DX: Localized edema: R60.0

## 2019-07-26 HISTORY — DX: Shortness of breath: R06.02

## 2019-07-26 HISTORY — DX: Bradycardia, unspecified: R00.1

## 2019-07-26 NOTE — Patient Instructions (Signed)
Medication Instructions:  Your physician recommends that you continue on your current medications as directed. Please refer to the Current Medication list given to you today.  *If you need a refill on your cardiac medications before your next appointment, please call your pharmacy*  Lab Work: None If you have labs (blood work) drawn today and your tests are completely normal, you will receive your results only by: Marland Kitchen MyChart Message (if you have MyChart) OR . A paper copy in the mail If you have any lab test that is abnormal or we need to change your treatment, we will call you to review the results.  Testing/Procedures: none  Follow-Up: At [facility], you and your health needs are our priority.  As part of our continuing mission to provide you with exceptional heart care, we have created designated Provider Care Teams.  These Care Teams include your primary Cardiologist (physician) and Advanced Practice Providers (APPs -  Physician Assistants and Nurse Practitioners) who all work together to provide you with the care you need, when you need it.  Your next appointment:   3 month(s)  The format for your next appointment:   In Person  Provider:   Berniece Salines, DO  Other Instructions

## 2019-07-26 NOTE — Progress Notes (Signed)
Cardiology Office Note:    Date:  07/26/2019   ID:  Carol Cummings, DOB [birth year], MRN [id number]  PCP:  [facility]  Cardiologist:  Berniece Salines, DO  Electrophysiologist:  None   Referring MD: [facility]*    Chief Complaint  Patient presents with  . Follow-up    History of Present Illness:    Carol Cummings is a 54 y.o. female with a hx of hypertension, hyperlipidemia, chronic respiratory failure due to COPD on 4 L cannula.    I initially saw the patient on April 25, 2019 at that time she was experiencing significant shortness of breath on exertion.  She told me that it was worse than her baseline COPD shortness of breath.  He has been taking Lasix for the PCP which was not helping with her shortness of breath.   Patient has visit recommended patient undergo a coronary CTA as well as an echocardiogram to rule out diastolic dysfunction and to assess for any coronary disease.  She was able to get her echocardiogram however the patient was not able to get her coronary CTA due to scheduling.  She tells me that she still is short of breath and feels that this is getting worse on exertion.  Past Medical History:  Diagnosis Date  . Emphysema lung ([place])   . Hypertension   . Oxygen dependent    2-6 L    Past Surgical History:  Procedure Laterality Date  . BLADDER SUSPENSION    . large intestine surgery    . LUNG LOBECTOMY     both upper lobes  . NASAL SEPTUM SURGERY    . TUBAL LIGATION      Current Medications: Current Meds  Medication Sig  . albuterol (PROAIR HFA) 108 (90 Base) MCG/ACT inhaler Inhale into the lungs 3 (three) times daily as needed.  Marland Kitchen albuterol (PROVENTIL) (2.5 MG/3ML) 0.083% nebulizer solution Take 2.5 mg by nebulization 4 (four) times daily.  Marland Kitchen ALPRAZolam (XANAX) 1 MG tablet 1 mg 2 (two) times daily.  Marland Kitchen azelastine (ASTELIN) 0.1 % nasal spray 2 (two) times daily.  Marland Kitchen azithromycin (ZITHROMAX) 250  MG tablet 250 mg 3 (three) times a week.  Marland Kitchen buPROPion (WELLBUTRIN SR) 150 MG 12 hr tablet Take by mouth 2 (two) times daily.  . furosemide (LASIX) 20 MG tablet Take 1 tablet (20 mg total) by mouth daily.  Marland Kitchen ibuprofen (ADVIL) 200 MG tablet Take by mouth.  Marland Kitchen ipratropium-albuterol (DUONEB) 0.5-2.5 (3) MG/3ML SOLN 4 (four) times daily.  . metoprolol tartrate (LOPRESSOR) 25 MG tablet Take 12.5 mg by mouth 2 (two) times daily.   . pantoprazole (PROTONIX) 40 MG tablet Take by mouth daily.  . potassium chloride SA (KLOR-CON M20) 20 MEQ tablet Take 1 tablet (20 mEq total) by mouth daily.  . rosuvastatin (CRESTOR) 10 MG tablet Take by mouth daily.  . [DISCONTINUED] ALPRAZolam (XANAX) 0.5 MG tablet Take by mouth 2 (two) times daily.     Allergies:   Topiramate and Phentermine   Social History   Socioeconomic History  . Marital status: Married    Spouse name: Not on file  . Number of children: Not on file  . Years of education: Not on file  . Highest education level: Not on file  Occupational History  . Not on file  Tobacco Use  . Smoking status: Former Research scientist (life sciences)  . Smokeless tobacco: Never Used  Substance and Sexual Activity  . Alcohol use: Not Currently  .  Drug use: Never  . Sexual activity: Not on file  Other Topics Concern  . Not on file  Social History Narrative  . Not on file   Social Determinants of Health   Financial Resource Strain:   . Difficulty of Paying Living Expenses: Not on file  Food Insecurity:   . Worried About Charity fundraiser in the Last Year: Not on file  . Ran Out of Food in the Last Year: Not on file  Transportation Needs:   . Lack of Transportation (Medical): Not on file  . Lack of Transportation (Non-Medical): Not on file  Physical Activity:   . Days of Exercise per Week: Not on file  . Minutes of Exercise per Session: Not on file  Stress:   . Feeling of Stress : Not on file  Social Connections:   . Frequency of Communication with Friends and Family:  Not on file  . Frequency of Social Gatherings with Friends and Family: Not on file  . Attends Religious Services: Not on file  . Active Member of Clubs or Organizations: Not on file  . Attends Archivist Meetings: Not on file  . Marital Status: Not on file     Family History: The patient's family history includes Aneurysm in her sister; Diabetes in her mother; Emphysema in her brother and father; Heart attack in her father; Hyperlipidemia in her mother; Hypertension in her mother; Stomach cancer in her mother; Stroke in her mother.  ROS:   Review of Systems  Constitution: Negative for decreased appetite, fever and weight gain.  HENT: Negative for congestion, ear discharge, hoarse voice and sore throat.   Eyes: Negative for discharge, redness, vision loss in right eye and visual halos.  Cardiovascular: Reports dyspnea on exertion. Negative for chest pain, leg swelling, orthopnea and palpitations.  Respiratory: Negative for cough, hemoptysis, shortness of breath and snoring.   Endocrine: Negative for heat intolerance and polyphagia.  Hematologic/Lymphatic: Negative for bleeding problem. Does not bruise/bleed easily.  Skin: Negative for flushing, nail changes, rash and suspicious lesions.  Musculoskeletal: Negative for arthritis, joint pain, muscle cramps, myalgias, neck pain and stiffness.  Gastrointestinal: Negative for abdominal pain, bowel incontinence, diarrhea and excessive appetite.  Genitourinary: Negative for decreased libido, genital sores and incomplete emptying.  Neurological: Negative for brief paralysis, focal weakness, headaches and loss of balance.  Psychiatric/Behavioral: Negative for altered mental status, depression and suicidal ideas.  Allergic/Immunologic: Negative for HIV exposure and persistent infections.    EKGs/Labs/Other Studies Reviewed:    The following studies were reviewed today:   EKG: None today  2D echo IMPRESSIONS 06/19/2019  1. Left  ventricular ejection fraction, by visual estimation, is 55 to  60%. The left ventricle has normal function. There is no left ventricular  hypertrophy.  2. Left ventricular diastolic parameters are consistent with Grade I  diastolic dysfunction (impaired relaxation).  3. The left ventricle has no regional wall motion abnormalities.  4. The mitral valve is normal in structure. No evidence of mitral valve  regurgitation. No evidence of mitral stenosis.  5. The tricuspid valve is normal in structure.   Recent Labs: 04/25/2019: BUN 10; Creatinine, Ser 0.99; Magnesium 1.8; Potassium 4.2; Sodium 143  Recent Lipid Panel    Component Value Date/Time   CHOL 170 04/25/2019 1013   TRIG 142 04/25/2019 1013   HDL 54 04/25/2019 1013   CHOLHDL 3.1 04/25/2019 1013   LDLCALC 91 04/25/2019 1013    Physical Exam:    VS:  BP  110/86 (BP Location: Right Arm, Patient Position: Sitting, Cuff Size: Normal)   Pulse (!) 50   Ht 5\' 5"  (1.651 m)   Wt 201 lb (91.2 kg)   SpO2 99%   BMI 33.45 kg/m     Wt Readings from Last 3 Encounters:  07/26/19 201 lb (91.2 kg)  04/25/19 200 lb (90.7 kg)     GEN: Well nourished, well developed in no acute distress HEENT: Normal NECK: No JVD; No carotid bruits LYMPHATICS: No lymphadenopathy CARDIAC: S1S2 noted,RRR, no murmurs, rubs, gallops RESPIRATORY:  Clear to auscultation without rales, wheezing or rhonchi  ABDOMEN: Soft, non-tender, non-distended, +bowel sounds, no guarding. EXTREMITIES: No edema, No cyanosis, no clubbing MUSCULOSKELETAL:  No deformity  SKIN: Warm and dry NEUROLOGIC:  Alert and oriented x 3, non-focal PSYCHIATRIC:  Normal affect, good insight  ASSESSMENT:    1. Shortness of breath   2. Mixed hyperlipidemia   3. Bilateral leg edema   4. Sinus bradycardia    PLAN:     Shortness of breath has not improved and is still is persistent.  The patient was not able to schedule her CTA coronaries which I do believe she still would need to  get done to be able to evaluate for coronary artery disease in the setting of her other point functional shortness of breath.  Also discussed the result with her echocardiogram and all of her questions were answered today during her visit.  She was bradycardic in the office today she is currently on Lopressor 12.5 mg twice a day.  She denies any dizziness therefore going to continue the patient on this medication.  Hyperlipidemia continue patient on Crestor 10 mg a day.  Bilateral leg edema improved continue patient on her current Lasix dose 20 mg daily.  The patient is in agreement with the above plan. The patient left the office in stable condition.  The patient will follow up in 3 months or sooner if needed.  Total visit time 35 minutes.  Medication Adjustments/Labs and Tests Ordered: Current medicines are reviewed at length with the patient today.  Concerns regarding medicines are outlined above.  No orders of the defined types were placed in this encounter.  No orders of the defined types were placed in this encounter.   Patient Instructions  Medication Instructions:  Your physician recommends that you continue on your current medications as directed. Please refer to the Current Medication list given to you today.  *If you need a refill on your cardiac medications before your next appointment, please call your pharmacy*  Lab Work: None If you have labs (blood work) drawn today and your tests are completely normal, you will receive your results only by: Marland Kitchen MyChart Message (if you have MyChart) OR . A paper copy in the mail If you have any lab test that is abnormal or we need to change your treatment, we will call you to review the results.  Testing/Procedures: none  Follow-Up: At [facility], you and your health needs are our priority.  As part of our continuing mission to provide you with exceptional heart care, we have created designated Provider Care Teams.  These Care  Teams include your primary Cardiologist (physician) and Advanced Practice Providers (APPs -  Physician Assistants and Nurse Practitioners) who all work together to provide you with the care you need, when you need it.  Your next appointment:   3 month(s)  The format for your next appointment:   In Person  Provider:   Godfrey Pick  Laisa Larrick, DO  Other Instructions      Adopting a Healthy Lifestyle.  Know what a healthy weight is for you (roughly BMI <25) and aim to maintain this   Aim for 7+ servings of fruits and vegetables daily   65-80+ fluid ounces of water or unsweet tea for healthy kidneys   Limit to max 1 drink of alcohol per day; avoid smoking/tobacco   Limit animal fats in diet for cholesterol and heart health - choose grass fed whenever available   Avoid highly processed foods, and foods high in saturated/trans fats   Aim for low stress - take time to unwind and care for your mental health   Aim for 150 min of moderate intensity exercise weekly for heart health, and weights twice weekly for bone health   Aim for 7-9 hours of sleep daily   When it comes to diets, agreement about the perfect plan isnt easy to find, even among the experts. Experts at the [employer] developed an idea known as the Healthy Eating Plate. Just imagine a plate divided into logical, healthy portions.   The emphasis is on diet quality:   Load up on vegetables and fruits - one-half of your plate: Aim for color and variety, and remember that potatoes dont count.   Go for whole grains - one-quarter of your plate: Whole wheat, barley, wheat berries, quinoa, oats, brown rice, and foods made with them. If you want pasta, go with whole wheat pasta.   Protein power - one-quarter of your plate: Fish, chicken, beans, and nuts are all healthy, versatile protein sources. Limit red meat.   The diet, however, does go beyond the plate, offering a few other suggestions.   Use healthy plant  oils, such as olive, canola, soy, corn, sunflower and peanut. Check the labels, and avoid partially hydrogenated oil, which have unhealthy trans fats.   If youre thirsty, drink water. Coffee and tea are good in moderation, but skip sugary drinks and limit milk and dairy products to one or two daily servings.   The type of carbohydrate in the diet is more important than the amount. Some sources of carbohydrates, such as vegetables, fruits, whole grains, and beans-are healthier than others.   Finally, stay active  Signed, Berniece Salines, DO  07/26/2019 10:53 PM    [facility]

## 2019-08-01 ENCOUNTER — Telehealth: Payer: Self-pay | Admitting: Cardiology

## 2019-08-01 NOTE — Telephone Encounter (Signed)
Monica from [facility] Dr. Maxie Barb office calling for the patient's echo results and office notes to be faxed to their office [phone].

## 2019-08-01 NOTE — Telephone Encounter (Signed)
Electronically faxed to Dr Maxie Barb office. 08/01/19

## 2019-08-22 ENCOUNTER — Telehealth (HOSPITAL_COMMUNITY): Payer: Self-pay | Admitting: Emergency Medicine

## 2019-08-22 NOTE — Telephone Encounter (Signed)
Reaching out to patient to offer assistance regarding upcoming cardiac imaging study; pt verbalizes understanding of appt date/time, parking situation and where to check in, pre-test NPO status and medications ordered, and verified current allergies; name and call back number provided for further questions should they arise Carol Bond RN [facility] [phone] office [phone] cell  Pt is OXYGEN DEPENDENT

## 2019-08-23 ENCOUNTER — Ambulatory Visit (HOSPITAL_COMMUNITY)
Admission: RE | Admit: 2019-08-23 | Discharge: 2019-08-23 | Disposition: A | Discharge status: Home / Self Care | Payer: Medicare Other | Source: Ambulatory Visit | Attending: Cardiology | Admitting: Cardiology

## 2019-08-23 ENCOUNTER — Other Ambulatory Visit: Payer: Self-pay

## 2019-08-23 DIAGNOSIS — R0602 Shortness of breath: Secondary | ICD-10-CM | POA: Diagnosis present

## 2019-08-23 DIAGNOSIS — I2510 Atherosclerotic heart disease of native coronary artery without angina pectoris: Secondary | ICD-10-CM | POA: Diagnosis not present

## 2019-08-23 DIAGNOSIS — J439 Emphysema, unspecified: Secondary | ICD-10-CM | POA: Diagnosis not present

## 2019-08-23 DIAGNOSIS — Z006 Encounter for examination for normal comparison and control in clinical research program: Secondary | ICD-10-CM

## 2019-08-23 MED ORDER — METOPROLOL TARTRATE 5 MG/5ML IV SOLN
5.0000 mg | INTRAVENOUS | Status: DC | PRN
  Administered 2019-08-23: 5 mg via INTRAVENOUS

## 2019-08-23 MED ORDER — METOPROLOL TARTRATE 5 MG/5ML IV SOLN
INTRAVENOUS | Status: AC
  Administered 2019-08-23: 5 mg via INTRAVENOUS
  Filled 2019-08-23: qty 5

## 2019-08-23 MED ORDER — NITROGLYCERIN 0.4 MG SL SUBL: 0.8000 mg | SUBLINGUAL_TABLET | Freq: Once | SUBLINGUAL | Status: DC

## 2019-08-23 MED ORDER — METOPROLOL TARTRATE 5 MG/5ML IV SOLN
INTRAVENOUS | Status: AC
  Filled 2019-08-23: qty 15

## 2019-08-23 MED ORDER — NITROGLYCERIN 0.4 MG SL SUBL
SUBLINGUAL_TABLET | SUBLINGUAL | Status: AC
  Filled 2019-08-23: qty 2

## 2019-08-23 MED ORDER — IOHEXOL 350 MG/ML SOLN
100.0000 mL | Freq: Once | INTRAVENOUS | Status: AC | PRN
  Administered 2019-08-23: 18:00:00 100 mL via INTRAVENOUS

## 2019-08-23 NOTE — Research (Signed)
[name] Informed Consent    Patient Name:    Subject met inclusion and exclusion criteria.  The informed consent form, study requirements and expectations were reviewed with the subject and questions and concerns were addressed prior to the signing of the consent form.  The subject verbalized understanding of the trail requirements.  The subject agreed to participate in the [name] trial and signed the informed consent.  The informed consent was obtained prior to performance of any protocol-specific procedures for the subject.  A copy of the signed informed consent was given to the subject and a copy was placed in the subject's medical record.   Carol Cummings

## 2019-08-24 DIAGNOSIS — I2510 Atherosclerotic heart disease of native coronary artery without angina pectoris: Secondary | ICD-10-CM | POA: Diagnosis not present

## 2019-08-25 ENCOUNTER — Telehealth: Payer: Self-pay | Admitting: *Deleted

## 2019-08-25 MED ORDER — ROSUVASTATIN CALCIUM 20 MG PO TABS: 20.0000 mg | ORAL_TABLET | Freq: Every day | ORAL | 3 refills | Status: DC

## 2019-08-25 MED ORDER — ASPIRIN EC 81 MG PO TBEC: 81.0000 mg | DELAYED_RELEASE_TABLET | Freq: Every day | ORAL | 3 refills | Status: AC

## 2019-08-25 NOTE — Telephone Encounter (Signed)
-----   Message from Berniece Salines, DO sent at 08/24/2019  9:03 PM EDT ----- Please let the patient know that her CTA showed evidence of coronary artery disease. I will like her to start Aspirin 81 mg daily if no history of bleeding. I want  increase the Crestor to 20 mg. We can discuss in more details at her next visit.

## 2019-09-04 ENCOUNTER — Telehealth: Payer: Self-pay | Admitting: Cardiology

## 2019-09-04 NOTE — Telephone Encounter (Signed)
Carol Cummings, from [facility] was doing intake for pulmonary rehab on the patient. Patient stated she is having chest pain everyday.  Nurse was wondering if patient would be better off doing cardiac rehab first.  If so she will need an order faxed over to them at [phone].

## 2019-09-04 NOTE — Telephone Encounter (Signed)
Lm to call back ./cy 

## 2019-09-06 NOTE — Telephone Encounter (Signed)
Follow up ° ° °Patient is returning your call. Please call. ° ° ° °

## 2019-09-06 NOTE — Telephone Encounter (Signed)
Please advise if we should do Cardiac Rehab first?  Thank you!  Will route to MD.

## 2019-09-07 NOTE — Telephone Encounter (Signed)
Please schedule the patient to see me, she does have coronary artery disease if she is having chest pain I like to start her on Imdur 30 mg daily.  Please start this prior to her appointment with me which I would prefer to be within the next 2 weeks.

## 2019-09-07 NOTE — Telephone Encounter (Signed)
Called patient back. She reports she does not have chest pain everyday this is not true she reports she does have it sometimes though and it is random when it comes. Informed her she needs to have a appointment with Dr. Harriet Masson was able to schedule her for tomorrow. Reached out to Dr. Agustin Cree because I wasn't able to reach Dr. Harriet Masson to confirm that the patient didn't need to start imdur since she is being seen tomorrow and the note regarding chest pain daily was incorrect. Dr. Agustin Cree advised she not start imdur at this time and that she would be seen tomorrow. Patient verbally understood. No further questions.

## 2019-09-07 NOTE — Telephone Encounter (Signed)
Hey! Not sure how to schedule with you guys, so just wanted to make sure she gets in.   Thank you!

## 2019-09-08 ENCOUNTER — Ambulatory Visit (INDEPENDENT_AMBULATORY_CARE_PROVIDER_SITE_OTHER): Payer: Medicare Other | Admitting: Cardiology

## 2019-09-08 ENCOUNTER — Other Ambulatory Visit: Payer: Self-pay

## 2019-09-08 ENCOUNTER — Encounter: Payer: Self-pay | Admitting: Cardiology

## 2019-09-08 VITALS — BP 130/68 | HR 102 | Ht 65.0 in | Wt 204.0 lb

## 2019-09-08 DIAGNOSIS — E782 Mixed hyperlipidemia: Secondary | ICD-10-CM | POA: Diagnosis not present

## 2019-09-08 DIAGNOSIS — J9611 Chronic respiratory failure with hypoxia: Secondary | ICD-10-CM

## 2019-09-08 DIAGNOSIS — I10 Essential (primary) hypertension: Secondary | ICD-10-CM | POA: Insufficient documentation

## 2019-09-08 DIAGNOSIS — I2510 Atherosclerotic heart disease of native coronary artery without angina pectoris: Secondary | ICD-10-CM

## 2019-09-08 HISTORY — DX: Essential (primary) hypertension: I10

## 2019-09-08 HISTORY — DX: Atherosclerotic heart disease of native coronary artery without angina pectoris: I25.10

## 2019-09-08 MED ORDER — ISOSORBIDE MONONITRATE ER 60 MG PO TB24: 60.0000 mg | ORAL_TABLET | Freq: Every day | ORAL | 12 refills | Status: DC

## 2019-09-08 MED ORDER — ISOSORBIDE MONONITRATE ER 30 MG PO TB24: 30.0000 mg | ORAL_TABLET | Freq: Every day | ORAL | 12 refills | Status: DC

## 2019-09-08 NOTE — Progress Notes (Signed)
Cardiology Office Note:    Date:  09/08/2019   ID:  Carol Cummings, DOB [birth year], MRN [id number]  PCP:  [facility]  Cardiologist:  Berniece Salines, DO  Electrophysiologist:  None   Referring MD: [facility]*   Follow-up visit  History of Present Illness:    Carol Cummings is a 54 y.o. female with a hx of coronary artery disease seen on coronary CTA with far showing small distal area of the LAD with flow limiting lesion, no other flow-limiting lesions significant, hypertension, hyperlipidemia emphysema/COPD on 4 L of oxygen, chronic respiratory failure presents today for follow-up visit.   The patient reports that she was referred to [facility] for pulmonary rehab by her pulmonologist Dr. Su Ley.  She noted that at the start of her rehab she was being as busy questions and when she admitted that she has been having intermittent chest pain all plans for starting pulmonary rehab were aborted and was asked to see her cardiologist.  Today she tells me that she has been experiencing intermittent chest pain which she described as fairly dull sensation which lasts for few seconds.  She notes that her shortness of breath really is worse than her chest pain.  She reports that she has been having worsening shortness of breath despite her oxygen and limited physical activity.  And it was for this reason that her pulmonologist recommended rehab.  Past Medical History:  Diagnosis Date  . Emphysema lung ([place])   . Hypertension   . Oxygen dependent    2-6 L    Past Surgical History:  Procedure Laterality Date  . BLADDER SUSPENSION    . large intestine surgery    . LUNG LOBECTOMY     both upper lobes  . NASAL SEPTUM SURGERY    . TUBAL LIGATION      Current Medications: Current Meds  Medication Sig  . acetaminophen (TYLENOL) 500 MG tablet Take 500 mg by mouth every 6 (six) hours as needed.  Marland Kitchen albuterol (PROAIR HFA) 108 (90 Base)  MCG/ACT inhaler Inhale into the lungs 3 (three) times daily as needed.  Marland Kitchen albuterol (PROVENTIL) (2.5 MG/3ML) 0.083% nebulizer solution Take 2.5 mg by nebulization 4 (four) times daily.  Marland Kitchen ALPRAZolam (XANAX) 1 MG tablet 1 mg 2 (two) times daily.  Marland Kitchen aspirin EC 81 MG tablet Take 1 tablet (81 mg total) by mouth daily.  Marland Kitchen azelastine (ASTELIN) 0.1 % nasal spray 2 (two) times daily.  Marland Kitchen azithromycin (ZITHROMAX) 250 MG tablet 250 mg 3 (three) times a week.  Marland Kitchen buPROPion (WELLBUTRIN SR) 150 MG 12 hr tablet Take by mouth 2 (two) times daily.  . furosemide (LASIX) 20 MG tablet Take 1 tablet (20 mg total) by mouth daily.  Marland Kitchen ipratropium-albuterol (DUONEB) 0.5-2.5 (3) MG/3ML SOLN 4 (four) times daily.  . metoprolol tartrate (LOPRESSOR) 25 MG tablet Take 12.5 mg by mouth 2 (two) times daily.   . pantoprazole (PROTONIX) 40 MG tablet Take by mouth daily.  . rosuvastatin (CRESTOR) 20 MG tablet Take 1 tablet (20 mg total) by mouth daily.  . TRELEGY ELLIPTA 100-62.5-25 MCG/INH AEPB Inhale 1 puff into the lungs daily.     Allergies:   Topiramate and Phentermine   Social History   Socioeconomic History  . Marital status: Married    Spouse name: Not on file  . Number of children: Not on file  . Years of education: Not on file  . Highest education level: Not on file  Occupational History  . Not on file  Tobacco Use  . Smoking status: Former Research scientist (life sciences)  . Smokeless tobacco: Never Used  Substance and Sexual Activity  . Alcohol use: Not Currently  . Drug use: Never  . Sexual activity: Not on file  Other Topics Concern  . Not on file  Social History Narrative  . Not on file   Social Determinants of Health   Financial Resource Strain:   . Difficulty of Paying Living Expenses:   Food Insecurity:   . Worried About Charity fundraiser in the Last Year:   . Arboriculturist in the Last Year:   Transportation Needs:   . Film/video editor (Medical):   Marland Kitchen Lack of Transportation (Non-Medical):   Physical  Activity:   . Days of Exercise per Week:   . Minutes of Exercise per Session:   Stress:   . Feeling of Stress :   Social Connections:   . Frequency of Communication with Friends and Family:   . Frequency of Social Gatherings with Friends and Family:   . Attends Religious Services:   . Active Member of Clubs or Organizations:   . Attends Archivist Meetings:   Marland Kitchen Marital Status:      Family History: The patient's family history includes Aneurysm in her sister; Diabetes in her mother; Emphysema in her brother and father; Heart attack in her father; Hyperlipidemia in her mother; Hypertension in her mother; Stomach cancer in her mother; Stroke in her mother.  ROS:   Review of Systems  Constitution: Negative for decreased appetite, fever and weight gain.  HENT: Negative for congestion, ear discharge, hoarse voice and sore throat.   Eyes: Negative for discharge, redness, vision loss in right eye and visual halos.  Cardiovascular: Reports chest pain and dyspnea on exertion.  Negative for leg swelling, orthopnea and palpitations.  Respiratory: Negative for cough, hemoptysis, shortness of breath and snoring.   Endocrine: Negative for heat intolerance and polyphagia.  Hematologic/Lymphatic: Negative for bleeding problem. Does not bruise/bleed easily.  Skin: Negative for flushing, nail changes, rash and suspicious lesions.  Musculoskeletal: Negative for arthritis, joint pain, muscle cramps, myalgias, neck pain and stiffness.  Gastrointestinal: Negative for abdominal pain, bowel incontinence, diarrhea and excessive appetite.  Genitourinary: Negative for decreased libido, genital sores and incomplete emptying.  Neurological: Negative for brief paralysis, focal weakness, headaches and loss of balance.  Psychiatric/Behavioral: Negative for altered mental status, depression and suicidal ideas.  Allergic/Immunologic: Negative for HIV exposure and persistent infections.    EKGs/Labs/Other  Studies Reviewed:    The following studies were reviewed today:   EKG:  The ekg ordered today demonstrates tachycardia, heart rate 102 bpm.  Poor R wave progression which could be suggestive of septal infarction similar to prior EKG.   CCTA IMPRESSION: 1. Coronary calcium score of 86. This was 57 percentile for age and sex matched control. 2. Normal coronary origin with Left dominance. 3. Moderate CAD in the mid LCx. CADRADS 3. This study will be sent for FFR.  CT FFR analysis  1. Left Main: 0.96  2. LAD: 0.92, 0.83, 0.78 (at the distal end)  3. LCX:0.90, 0.86  4. RCA: 0.89  IMPRESSION: FFR ct significant stenosis (0.78) at the distal end of the LAD as it tapers off. This is a small area. Recommend aggressive medical therapy before attempt for revascularization. No other evidence of significant stenosis or flow limiting lesion.     TTE IMPRESSIONS January 11, 20211. Left ventricular  ejection fraction, by visual estimation, is 55 to  60%. The left ventricle has normal function. There is no left ventricular  hypertrophy.  2. Left ventricular diastolic parameters are consistent with Grade I  diastolic dysfunction (impaired relaxation).  3. The left ventricle has no regional wall motion abnormalities.  4. The mitral valve is normal in structure. No evidence of mitral valve  regurgitation. No evidence of mitral stenosis.  5. The tricuspid valve is normal in structure.   Recent Labs: 04/25/2019: BUN 10; Creatinine, Ser 0.99; Magnesium 1.8; Potassium 4.2; Sodium 143  Recent Lipid Panel    Component Value Date/Time   CHOL 170 04/25/2019 1013   TRIG 142 04/25/2019 1013   HDL 54 04/25/2019 1013   CHOLHDL 3.1 04/25/2019 1013   LDLCALC 91 04/25/2019 1013    Physical Exam:    VS:  BP 130/68   Pulse (!) 102   Ht 5\' 5"  (1.651 m)   Wt 204 lb (92.5 kg)   SpO2 98%   BMI 33.95 kg/m     Wt Readings from Last 3 Encounters:  09/08/19 204 lb (92.5 kg)  07/26/19  201 lb (91.2 kg)  04/25/19 200 lb (90.7 kg)     GEN: Well nourished, well developed in no acute distress HEENT: Normal NECK: No JVD; No carotid bruits LYMPHATICS: No lymphadenopathy CARDIAC: S1S2 noted,RRR, no murmurs, rubs, gallops RESPIRATORY:  Clear to auscultation without rales, wheezing or rhonchi  ABDOMEN: Soft, non-tender, non-distended, +bowel sounds, no guarding. EXTREMITIES: No edema, No cyanosis, no clubbing MUSCULOSKELETAL:  No deformity  SKIN: Warm and dry NEUROLOGIC:  Alert and oriented x 3, non-focal PSYCHIATRIC:  Normal affect, good insight  ASSESSMENT:    1. Coronary artery disease involving native coronary artery of native heart, angina presence unspecified   2. Essential hypertension   3. Mixed hyperlipidemia   4. Chronic respiratory failure with hypoxia (HCC)    PLAN:    She does have coronary artery disease by coronary CTA.  She is currently on aspirin 81 mg daily along with Crestor 20 mg daily.  I am going to add imdur 30 mg daily for her recent intermittent dull chest pain.  I am hoping that the patient gets some relief medically and will reassess her at her next visit.  Her shortness of breath is significantly multifactorial mainly from her chronic respiratory failure and emphysema. She needs pulmonary rehab as per her pulmonologist.  She certainly can also do cardiac rehab at the same program as well.     She can continue her rehab program, and will reassess at her next office visit which will be in 1 month.   Hypertension-her systolic blood pressure slightly elevated than usual.  We will continue to monitor.  She is also being start Imdur 30 mg a day to help with her blood pressure as well.  The patient is in agreement with the above plan. The patient left the office in stable condition.  The patient will follow up in 1 month or sooner if needed.   Medication Adjustments/Labs and Tests Ordered: Current medicines are reviewed at length with the patient  today.  Concerns regarding medicines are outlined above.  Orders Placed This Encounter  Procedures  . AMB referral to cardiac rehabilitation  . EKG 12-Lead   Meds ordered this encounter  Medications  . isosorbide mononitrate (IMDUR) 60 MG 24 hr tablet    Sig: Take 1 tablet (60 mg total) by mouth daily.    Dispense:  30 tablet  Refill:  12    Patient Instructions  Medication Instructions:  Your physician has recommended you make the following change in your medication:   Start Imdur 30 mg daily.  *If you need a refill on your cardiac medications before your next appointment, please call your pharmacy*   Lab Work: None ordered If you have labs (blood work) drawn today and your tests are completely normal, you will receive your results only by: Marland Kitchen MyChart Message (if you have MyChart) OR . A paper copy in the mail If you have any lab test that is abnormal or we need to change your treatment, we will call you to review the results.   Testing/Procedures: None ordereed   Follow-Up: At [facility], you and your health needs are our priority.  As part of our continuing mission to provide you with exceptional heart care, we have created designated Provider Care Teams.  These Care Teams include your primary Cardiologist (physician) and Advanced Practice Providers (APPs -  Physician Assistants and Nurse Practitioners) who all work together to provide you with the care you need, when you need it.  We recommend signing up for the patient portal called "MyChart".  Sign up information is provided on this After Visit Summary.  MyChart is used to connect with patients for Virtual Visits (Telemedicine).  Patients are able to view lab/test results, encounter notes, upcoming appointments, etc.  Non-urgent messages can be sent to your provider as well.   To learn more about what you can do with MyChart, go to [email or web address].    Your next appointment:   1 month(s)  The format for  your next appointment:   In Person  Provider:   Berniece Salines, DO   Other Instructions Referral has been placed for cardiac rehab at [facility].     Adopting a Healthy Lifestyle.  Know what a healthy weight is for you (roughly BMI <25) and aim to maintain this   Aim for 7+ servings of fruits and vegetables daily   65-80+ fluid ounces of water or unsweet tea for healthy kidneys   Limit to max 1 drink of alcohol per day; avoid smoking/tobacco   Limit animal fats in diet for cholesterol and heart health - choose grass fed whenever available   Avoid highly processed foods, and foods high in saturated/trans fats   Aim for low stress - take time to unwind and care for your mental health   Aim for 150 min of moderate intensity exercise weekly for heart health, and weights twice weekly for bone health   Aim for 7-9 hours of sleep daily   When it comes to diets, agreement about the perfect plan isnt easy to find, even among the experts. Experts at the [employer] developed an idea known as the Healthy Eating Plate. Just imagine a plate divided into logical, healthy portions.   The emphasis is on diet quality:   Load up on vegetables and fruits - one-half of your plate: Aim for color and variety, and remember that potatoes dont count.   Go for whole grains - one-quarter of your plate: Whole wheat, barley, wheat berries, quinoa, oats, brown rice, and foods made with them. If you want pasta, go with whole wheat pasta.   Protein power - one-quarter of your plate: Fish, chicken, beans, and nuts are all healthy, versatile protein sources. Limit red meat.   The diet, however, does go beyond the plate, offering a few other suggestions.   Use  healthy plant oils, such as olive, canola, soy, corn, sunflower and peanut. Check the labels, and avoid partially hydrogenated oil, which have unhealthy trans fats.   If youre thirsty, drink water. Coffee and tea are good  in moderation, but skip sugary drinks and limit milk and dairy products to one or two daily servings.   The type of carbohydrate in the diet is more important than the amount. Some sources of carbohydrates, such as vegetables, fruits, whole grains, and beans-are healthier than others.   Finally, stay active  Signed, Berniece Salines, DO  09/08/2019 2:35 PM    [facility]

## 2019-09-08 NOTE — Patient Instructions (Addendum)
Medication Instructions:  Your physician has recommended you make the following change in your medication:   Start Imdur 30 mg daily.  *If you need a refill on your cardiac medications before your next appointment, please call your pharmacy*   Lab Work: None ordered If you have labs (blood work) drawn today and your tests are completely normal, you will receive your results only by: Marland Kitchen MyChart Message (if you have MyChart) OR . A paper copy in the mail If you have any lab test that is abnormal or we need to change your treatment, we will call you to review the results.   Testing/Procedures: None ordereed   Follow-Up: At [facility], you and your health needs are our priority.  As part of our continuing mission to provide you with exceptional heart care, we have created designated Provider Care Teams.  These Care Teams include your primary Cardiologist (physician) and Advanced Practice Providers (APPs -  Physician Assistants and Nurse Practitioners) who all work together to provide you with the care you need, when you need it.  We recommend signing up for the patient portal called "MyChart".  Sign up information is provided on this After Visit Summary.  MyChart is used to connect with patients for Virtual Visits (Telemedicine).  Patients are able to view lab/test results, encounter notes, upcoming appointments, etc.  Non-urgent messages can be sent to your provider as well.   To learn more about what you can do with MyChart, go to [email or web address].    Your next appointment:   1 month(s)  The format for your next appointment:   In Person  Provider:   Berniece Salines, DO   Other Instructions Referral has been placed for cardiac rehab at [facility].

## 2019-09-19 ENCOUNTER — Telehealth: Payer: Self-pay | Admitting: Cardiology

## 2019-09-19 NOTE — Telephone Encounter (Signed)
[id number] 4/13

## 2019-09-19 NOTE — Telephone Encounter (Signed)
Patient returning call.

## 2019-09-19 NOTE — Telephone Encounter (Signed)
Patient called and stated she needed Dr. Harriet Masson to wear a monitor to wear during her Cardiac Pulmonary Rehab. Please call to discuss

## 2019-09-19 NOTE — Telephone Encounter (Signed)
The patient called and inquiring whether or not she has to wear a monitor during her Cardiac Rehab, which may have been discussed at her 4/2 OV with Dr Harriet Masson.  I told her that I would forward to the nurse involved during the visit to advise.  She verbalized understanding.

## 2019-09-22 NOTE — Telephone Encounter (Signed)
That will be fine she can use a cardiac monitor while doing pulmonary rehab.

## 2019-09-22 NOTE — Telephone Encounter (Signed)
Pt states that she needs a note for pulmonary rehab to tell them if she needs or does not need a cardiac monitor while doing rehab How do you advise?

## 2019-09-27 NOTE — Telephone Encounter (Signed)
Message left for pt to use a cardiac monitor on her voice mail.

## 2019-10-05 ENCOUNTER — Encounter: Payer: Self-pay | Admitting: Cardiology

## 2019-10-06 ENCOUNTER — Ambulatory Visit (INDEPENDENT_AMBULATORY_CARE_PROVIDER_SITE_OTHER): Payer: Medicare Other | Admitting: Cardiology

## 2019-10-06 ENCOUNTER — Encounter: Payer: Self-pay | Admitting: Cardiology

## 2019-10-06 ENCOUNTER — Other Ambulatory Visit: Payer: Self-pay

## 2019-10-06 VITALS — BP 132/90 | HR 64 | Ht 65.0 in | Wt 200.0 lb

## 2019-10-06 DIAGNOSIS — I10 Essential (primary) hypertension: Secondary | ICD-10-CM

## 2019-10-06 DIAGNOSIS — I2510 Atherosclerotic heart disease of native coronary artery without angina pectoris: Secondary | ICD-10-CM

## 2019-10-06 DIAGNOSIS — R0602 Shortness of breath: Secondary | ICD-10-CM

## 2019-10-06 DIAGNOSIS — E782 Mixed hyperlipidemia: Secondary | ICD-10-CM

## 2019-10-06 DIAGNOSIS — R600 Localized edema: Secondary | ICD-10-CM | POA: Diagnosis not present

## 2019-10-06 MED ORDER — POTASSIUM CHLORIDE CRYS ER 20 MEQ PO TBCR: EXTENDED_RELEASE_TABLET | ORAL | 0 refills | Status: DC

## 2019-10-06 MED ORDER — FUROSEMIDE 20 MG PO TABS: ORAL_TABLET | ORAL | 0 refills | Status: DC

## 2019-10-06 NOTE — Progress Notes (Signed)
Cardiology Office Note:    Date:  10/06/2019   ID:  Carol Cummings, DOB 1965-12-27, MRN [id number]  PCP:  [facility]  Cardiologist:  Berniece Salines, DO  Electrophysiologist:  None   Referring MD: [facility]*   Chief Complaint  Patient presents with  . Follow-up    1 Month   History of Present Illness:    Carol Cummings is a 54 y.o. female with a hx of coronary artery disease seen on coronary CTA with far showing small distal area of the LAD with flow limiting lesion, no other flow-limiting lesions significant, hypertension, hyperlipidemia emphysema/COPD on 4 L of oxygen, chronic respiratory failure presents today for follow-up visit.   The last time I saw the patient was due to request by the pulmonary rehab facility given the patient did have some chest pain during rehab.  She also short of breath which likely in the setting of her COPD.  The fact that she has coronary artery disease with this chest pain I started her Imdur for the patient however has stopped this medication due to headaches.  At that time I did asked the patient that she would continue her pulmonary rehab and also okay for her to also start cardiac rehab.  In the interim I was able to sign every paperwork that was requested by the cardiac rehab facility at [facility] been  available to answer all of their questions.  Today she offers no specific complaints.  She does tell me she is short of breath her baseline on her home oxygen.  No other complaints at this time   Past Medical History:  Diagnosis Date  . Emphysema lung ([place])   . Hypertension   . Oxygen dependent    2-6 L    Past Surgical History:  Procedure Laterality Date  . BLADDER SUSPENSION    . large intestine surgery    . LUNG LOBECTOMY     both upper lobes  . NASAL SEPTUM SURGERY    . TUBAL LIGATION      Current Medications: Current Meds  Medication Sig  . acetaminophen (TYLENOL) 500  MG tablet Take 500 mg by mouth every 6 (six) hours as needed.  Marland Kitchen albuterol (PROAIR HFA) 108 (90 Base) MCG/ACT inhaler Inhale into the lungs 3 (three) times daily as needed.  Marland Kitchen albuterol (PROVENTIL) (2.5 MG/3ML) 0.083% nebulizer solution Take 2.5 mg by nebulization 4 (four) times daily.  Marland Kitchen ALPRAZolam (XANAX) 1 MG tablet 1 mg 2 (two) times daily.  Marland Kitchen aspirin EC 81 MG tablet Take 1 tablet (81 mg total) by mouth daily.  Marland Kitchen azelastine (ASTELIN) 0.1 % nasal spray 2 (two) times daily.  Marland Kitchen azithromycin (ZITHROMAX) 250 MG tablet 250 mg 3 (three) times a week.  Marland Kitchen buPROPion (WELLBUTRIN SR) 150 MG 12 hr tablet Take by mouth 2 (two) times daily.  . furosemide (LASIX) 20 MG tablet Take one tablet on Tuesdays and Saturdays  . ipratropium-albuterol (DUONEB) 0.5-2.5 (3) MG/3ML SOLN 4 (four) times daily.  . metoprolol tartrate (LOPRESSOR) 25 MG tablet Take 12.5 mg by mouth 2 (two) times daily.   . pantoprazole (PROTONIX) 40 MG tablet Take by mouth daily.  . potassium chloride SA (KLOR-CON M20) 20 MEQ tablet Take one tablet on Tuesdays and Saturdays.  . rosuvastatin (CRESTOR) 20 MG tablet Take 1 tablet (20 mg total) by mouth daily.  . TRELEGY ELLIPTA 100-62.5-25 MCG/INH AEPB Inhale 1 puff into the lungs daily.  . [DISCONTINUED] furosemide (  LASIX) 20 MG tablet Take 1 tablet (20 mg total) by mouth daily.  . [DISCONTINUED] potassium chloride SA (KLOR-CON M20) 20 MEQ tablet Take 1 tablet (20 mEq total) by mouth daily.     Allergies:   Topiramate and Phentermine   Social History   Socioeconomic History  . Marital status: Married    Spouse name: Not on file  . Number of children: Not on file  . Years of education: Not on file  . Highest education level: Not on file  Occupational History  . Not on file  Tobacco Use  . Smoking status: Former Research scientist (life sciences)  . Smokeless tobacco: Never Used  Substance and Sexual Activity  . Alcohol use: Not Currently  . Drug use: Never  . Sexual activity: Not on file  Other Topics  Concern  . Not on file  Social History Narrative  . Not on file   Social Determinants of Health   Financial Resource Strain:   . Difficulty of Paying Living Expenses:   Food Insecurity:   . Worried About Charity fundraiser in the Last Year:   . Arboriculturist in the Last Year:   Transportation Needs:   . Film/video editor (Medical):   Marland Kitchen Lack of Transportation (Non-Medical):   Physical Activity:   . Days of Exercise per Week:   . Minutes of Exercise per Session:   Stress:   . Feeling of Stress :   Social Connections:   . Frequency of Communication with Friends and Family:   . Frequency of Social Gatherings with Friends and Family:   . Attends Religious Services:   . Active Member of Clubs or Organizations:   . Attends Archivist Meetings:   Marland Kitchen Marital Status:      Family History: The patient's family history includes Aneurysm in her sister; Diabetes in her mother; Emphysema in her brother and father; Heart attack in her father; Hyperlipidemia in her mother; Hypertension in her mother; Stomach cancer in her mother; Stroke in her mother.  ROS:   Review of Systems  Constitution: Negative for decreased appetite, fever and weight gain.  HENT: Negative for congestion, ear discharge, hoarse voice and sore throat.   Eyes: Negative for discharge, redness, vision loss in right eye and visual halos.  Cardiovascular: Negative for chest pain, dyspnea on exertion, leg swelling, orthopnea and palpitations.  Respiratory: Negative for cough, hemoptysis, shortness of breath and snoring.   Endocrine: Negative for heat intolerance and polyphagia.  Hematologic/Lymphatic: Negative for bleeding problem. Does not bruise/bleed easily.  Skin: Negative for flushing, nail changes, rash and suspicious lesions.  Musculoskeletal: Negative for arthritis, joint pain, muscle cramps, myalgias, neck pain and stiffness.  Gastrointestinal: Negative for abdominal pain, bowel incontinence, diarrhea  and excessive appetite.  Genitourinary: Negative for decreased libido, genital sores and incomplete emptying.  Neurological: Negative for brief paralysis, focal weakness, headaches and loss of balance.  Psychiatric/Behavioral: Negative for altered mental status, depression and suicidal ideas.  Allergic/Immunologic: Negative for HIV exposure and persistent infections.    EKGs/Labs/Other Studies Reviewed:    The following studies were reviewed today:   EKG: None today  CCTA IMPRESSION: 1. Coronary calcium score of 86. This was 13 percentile for age and sex matched control. 2. Normal coronary origin with Left dominance. 3. Moderate CAD in the mid LCx. CADRADS 3. This study will be sent for FFR.  CT FFR analysis  1. Left Main: 0.96  2. LAD: 0.92, 0.83, 0.78 (at the distal end)  3. LCX:0.90, 0.86  4. RCA: 0.89  IMPRESSION: FFR ct significant stenosis (0.78) at the distal end of the LAD as it tapers off. This is a small area. Recommend aggressive medical therapy before attempt for revascularization. No other evidence of significant stenosis or flow limiting lesion.  TTE IMPRESSIONS  January 11, 20211. Left ventricular ejection fraction, by visual estimation, is 55 to  60%. The left ventricle has normal function. There is no left ventricular  hypertrophy.  2. Left ventricular diastolic parameters are consistent with Grade I  diastolic dysfunction (impaired relaxation).  3. The left ventricle has no regional wall motion abnormalities.  4. The mitral valve is normal in structure. No evidence of mitral valve  regurgitation. No evidence of mitral stenosis.  5. The tricuspid valve is normal in structure.    Recent Labs: 04/25/2019: BUN 10; Creatinine, Ser 0.99; Magnesium 1.8; Potassium 4.2; Sodium 143  Recent Lipid Panel    Component Value Date/Time   CHOL 170 04/25/2019 1013   TRIG 142 04/25/2019 1013   HDL 54 04/25/2019 1013   CHOLHDL 3.1 04/25/2019 1013    LDLCALC 91 04/25/2019 1013    Physical Exam:    VS:  BP 132/90   Pulse 64   Ht 5\' 5"  (1.651 m)   Wt 200 lb (90.7 kg)   SpO2 99% Comment: 4 Liters of Oxygen  BMI 33.28 kg/m     Wt Readings from Last 3 Encounters:  10/06/19 200 lb (90.7 kg)  09/08/19 204 lb (92.5 kg)  07/26/19 201 lb (91.2 kg)     GEN: Well nourished, well developed in no acute distress HEENT: Normal NECK: No JVD; No carotid bruits LYMPHATICS: No lymphadenopathy CARDIAC: S1S2 noted,RRR, no murmurs, rubs, gallops RESPIRATORY:  Clear to auscultation without rales, wheezing or rhonchi  ABDOMEN: Soft, non-tender, non-distended, +bowel sounds, no guarding. EXTREMITIES: No edema, No cyanosis, no clubbing MUSCULOSKELETAL:  No deformity  SKIN: Warm and dry NEUROLOGIC:  Alert and oriented x 3, non-focal PSYCHIATRIC:  Normal affect, good insight  ASSESSMENT:    1. Shortness of breath   2. Coronary artery disease involving native coronary artery of native heart, angina presence unspecified   3. Bilateral leg edema   4. Mixed hyperlipidemia   5. Essential hypertension    PLAN:    Clinically she appears to be at her baseline.  She offers no complaints.  Unfortunately she cannot tolerate the Imdur but she has not been having any repeated chest pain.  So for now may go to start any additional antianginals.  Same to her current regimen is working.  Her bilateral leg edema has improved significantly.  Therefore I am going to cut back on her Lasix to twice weekly with potassium supplements.  She will continue her aspirin as well as her rosuvastatin coronary artery disease.  Hyperlipidemia continue patient on her atorvastatin.  I do think the patient is clinically at her baseline and should be allowed to continue her pulmonary rehab along with her cardiac rehab.  Shared with the patient is doing her pulmonary rehab will be okay for her to use cardiac monitoring.  Blood work be done today to assess electrolytes and  kidney function as patient has been on 30 diuretics.  The patient is in agreement with the above plan. The patient left the office in stable condition.  The patient will follow up in 3 months or sooner if needed.   Medication Adjustments/Labs and Tests Ordered: Current medicines are reviewed at length with the patient  today.  Concerns regarding medicines are outlined above.  Orders Placed This Encounter  Procedures  . Basic metabolic panel  . Magnesium   Meds ordered this encounter  Medications  . furosemide (LASIX) 20 MG tablet    Sig: Take one tablet on Tuesdays and Saturdays    Dispense:  30 tablet    Refill:  0  . potassium chloride SA (KLOR-CON M20) 20 MEQ tablet    Sig: Take one tablet on Tuesdays and Saturdays.    Dispense:  30 tablet    Refill:  0    Patient Instructions  Medication Instructions:  Your physician has recommended you make the following change in your medication:   DECREASE : Lasix 20 mg on tuesdays and saturdays   DECREASE: Potassium 20 meq on tuesdays and saturdays   *If you need a refill on your cardiac medications before your next appointment, please call your pharmacy*   Lab Work: Your physician recommends that you return for lab work today: bmt, mg   If you have labs (blood work) drawn today and your tests are completely normal, you will receive your results only by: Marland Kitchen MyChart Message (if you have MyChart) OR . A paper copy in the mail If you have any lab test that is abnormal or we need to change your treatment, we will call you to review the results.   Testing/Procedures: None.    Follow-Up: At [facility], you and your health needs are our priority.  As part of our continuing mission to provide you with exceptional heart care, we have created designated Provider Care Teams.  These Care Teams include your primary Cardiologist (physician) and Advanced Practice Providers (APPs -  Physician Assistants and Nurse Practitioners) who all  work together to provide you with the care you need, when you need it.  We recommend signing up for the patient portal called "MyChart".  Sign up information is provided on this After Visit Summary.  MyChart is used to connect with patients for Virtual Visits (Telemedicine).  Patients are able to view lab/test results, encounter notes, upcoming appointments, etc.  Non-urgent messages can be sent to your provider as well.   To learn more about what you can do with MyChart, go to [email or web address].    Your next appointment:   3 month(s)  The format for your next appointment:   In Person  Provider:   Berniece Salines, DO   Other Instructions       Adopting a Healthy Lifestyle.  Know what a healthy weight is for you (roughly BMI <25) and aim to maintain this   Aim for 7+ servings of fruits and vegetables daily   65-80+ fluid ounces of water or unsweet tea for healthy kidneys   Limit to max 1 drink of alcohol per day; avoid smoking/tobacco   Limit animal fats in diet for cholesterol and heart health - choose grass fed whenever available   Avoid highly processed foods, and foods high in saturated/trans fats   Aim for low stress - take time to unwind and care for your mental health   Aim for 150 min of moderate intensity exercise weekly for heart health, and weights twice weekly for bone health   Aim for 7-9 hours of sleep daily   When it comes to diets, agreement about the perfect plan isnt easy to find, even among the experts. Experts at the [place] developed an idea known as the Healthy Eating Plate. Just imagine a plate  divided into logical, healthy portions.   The emphasis is on diet quality:   Load up on vegetables and fruits - one-half of your plate: Aim for color and variety, and remember that potatoes dont count.   Go for whole grains - one-quarter of your plate: Whole wheat, barley, wheat berries, quinoa, oats, brown rice, and foods made  with them. If you want pasta, go with whole wheat pasta.   Protein power - one-quarter of your plate: Fish, chicken, beans, and nuts are all healthy, versatile protein sources. Limit red meat.   The diet, however, does go beyond the plate, offering a few other suggestions.   Use healthy plant oils, such as olive, canola, soy, corn, sunflower and peanut. Check the labels, and avoid partially hydrogenated oil, which have unhealthy trans fats.   If youre thirsty, drink water. Coffee and tea are good in moderation, but skip sugary drinks and limit milk and dairy products to one or two daily servings.   The type of carbohydrate in the diet is more important than the amount. Some sources of carbohydrates, such as vegetables, fruits, whole grains, and beans-are healthier than others.   Finally, stay active  Signed, Berniece Salines, DO  10/06/2019 2:11 PM    [facility]

## 2019-10-06 NOTE — Patient Instructions (Signed)
Medication Instructions:  Your physician has recommended you make the following change in your medication:   DECREASE : Lasix 20 mg on tuesdays and saturdays   DECREASE: Potassium 20 meq on tuesdays and saturdays   *If you need a refill on your cardiac medications before your next appointment, please call your pharmacy*   Lab Work: Your physician recommends that you return for lab work today: bmt, mg   If you have labs (blood work) drawn today and your tests are completely normal, you will receive your results only by: Marland Kitchen MyChart Message (if you have MyChart) OR . A paper copy in the mail If you have any lab test that is abnormal or we need to change your treatment, we will call you to review the results.   Testing/Procedures: None.    Follow-Up: At [facility], you and your health needs are our priority.  As part of our continuing mission to provide you with exceptional heart care, we have created designated Provider Care Teams.  These Care Teams include your primary Cardiologist (physician) and Advanced Practice Providers (APPs -  Physician Assistants and Nurse Practitioners) who all work together to provide you with the care you need, when you need it.  We recommend signing up for the patient portal called "MyChart".  Sign up information is provided on this After Visit Summary.  MyChart is used to connect with patients for Virtual Visits (Telemedicine).  Patients are able to view lab/test results, encounter notes, upcoming appointments, etc.  Non-urgent messages can be sent to your provider as well.   To learn more about what you can do with MyChart, go to [email or web address].    Your next appointment:   3 month(s)  The format for your next appointment:   In Person  Provider:   Berniece Salines, DO   Other Instructions

## 2019-10-07 LAB — BASIC METABOLIC PANEL
BUN/Creatinine Ratio: 13 (ref 9–23)
BUN: 12 mg/dL (ref 6–24)
CO2: 24 mmol/L (ref 20–29)
Calcium: 9.3 mg/dL (ref 8.7–10.2)
Chloride: 104 mmol/L (ref 96–106)
Creatinine, Ser: 0.89 mg/dL (ref 0.57–1.00)
GFR calc Af Amer: 86 mL/min/{1.73_m2} (ref >59)
GFR calc non Af Amer: 74 mL/min/{1.73_m2} (ref >59)
Glucose: 93 mg/dL (ref 65–99)
Potassium: 4.7 mmol/L (ref 3.5–5.2)
Sodium: 144 mmol/L (ref 134–144)

## 2019-10-07 LAB — MAGNESIUM: Magnesium: 2 mg/dL (ref 1.6–2.3)

## 2019-10-09 ENCOUNTER — Telehealth: Payer: Self-pay | Admitting: Cardiology

## 2019-10-09 NOTE — Telephone Encounter (Signed)
     I went in pt's chart to see who called pt today(10-09-19)

## 2019-10-10 ENCOUNTER — Telehealth: Payer: Self-pay | Admitting: Cardiology

## 2019-10-10 NOTE — Telephone Encounter (Signed)
Carol Cummings is requesting to have Dr. Harriet Masson or her nurse re-fax the referral for cardiac rehab. She states that they did not receive it. Please fax to both provided numbers to ensure that they get it and Attention to [place].   [phone] and [phone]

## 2019-10-10 NOTE — Telephone Encounter (Signed)
I faxed it and they even told the pt she needed to know if she needed to wear a cardiac monitor for rehab.

## 2019-10-26 ENCOUNTER — Other Ambulatory Visit: Payer: Self-pay | Admitting: Cardiology

## 2019-11-07 ENCOUNTER — Ambulatory Visit: Payer: Medicare Other | Admitting: Cardiology

## 2019-11-08 ENCOUNTER — Ambulatory Visit: Payer: Medicare Other | Admitting: Cardiology

## 2019-12-19 DIAGNOSIS — C672 Malignant neoplasm of lateral wall of bladder: Secondary | ICD-10-CM | POA: Diagnosis not present

## 2020-01-05 ENCOUNTER — Other Ambulatory Visit: Payer: Self-pay

## 2020-01-05 ENCOUNTER — Encounter: Payer: Self-pay | Admitting: Cardiology

## 2020-01-05 ENCOUNTER — Ambulatory Visit (INDEPENDENT_AMBULATORY_CARE_PROVIDER_SITE_OTHER): Payer: Medicare Other | Admitting: Cardiology

## 2020-01-05 VITALS — BP 130/86 | HR 84 | Ht 65.0 in | Wt 206.0 lb

## 2020-01-05 DIAGNOSIS — E782 Mixed hyperlipidemia: Secondary | ICD-10-CM

## 2020-01-05 DIAGNOSIS — J9611 Chronic respiratory failure with hypoxia: Secondary | ICD-10-CM | POA: Diagnosis not present

## 2020-01-05 DIAGNOSIS — I2510 Atherosclerotic heart disease of native coronary artery without angina pectoris: Secondary | ICD-10-CM

## 2020-01-05 DIAGNOSIS — I10 Essential (primary) hypertension: Secondary | ICD-10-CM | POA: Diagnosis not present

## 2020-01-05 NOTE — Progress Notes (Signed)
Cardiology Office Note:    Date:  01/05/2020   ID:  Troyce Febo, DOB Oct 01, 1965, MRN [id number]  PCP:  [facility]  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   At her last visit she was participating in the pulmonary and cardiac rehab at [facility].  The patient tells me since I last saw her she has been diagnosed with severe anemia.  She has been receiving transfusions by the hematologist.  She is feeling a lot better.  Shortness of breath is improving.  No other complaints at this time.  Of note she had to hold on cardiac and pulmonary rehab due to her diagnosis of anemia.  Past Medical History:  Diagnosis Date  . Emphysema lung ([place])   . Hypertension   . Oxygen dependent    2-6 L    Past Surgical History:  Procedure Laterality Date  . BLADDER SUSPENSION    . large intestine surgery    . LUNG LOBECTOMY     both upper lobes  . NASAL SEPTUM SURGERY    . TUBAL LIGATION      Current Medications: Current Meds  Medication Sig  . acetaminophen (TYLENOL) 500 MG tablet Take 500 mg by mouth every 6 (six) hours as needed.  Marland Kitchen albuterol (PROAIR HFA) 108 (90 Base) MCG/ACT inhaler Inhale into the lungs 3 (three) times daily as needed.  Marland Kitchen albuterol (PROVENTIL) (2.5 MG/3ML) 0.083% nebulizer solution Take 2.5 mg by nebulization 4 (four) times daily.  Marland Kitchen ALPRAZolam (XANAX) 1 MG tablet 1 mg 2 (two) times daily.  Marland Kitchen aspirin EC 81 MG tablet Take 1 tablet (81 mg  total) by mouth daily.  Marland Kitchen azelastine (ASTELIN) 0.1 % nasal spray 2 (two) times daily.  Marland Kitchen azithromycin (ZITHROMAX) 250 MG tablet 250 mg 3 (three) times a week.  . budesonide (PULMICORT) 0.5 MG/2ML nebulizer solution Take by nebulization 2 (two) times daily.  Marland Kitchen buPROPion (WELLBUTRIN SR) 150 MG 12 hr tablet Take by mouth 2 (two) times daily.  . Fluticasone-Umeclidin-Vilant (TRELEGY ELLIPTA) 200-62.5-25 MCG/INH AEPB Inhale into the lungs daily.  . furosemide (LASIX) 20 MG tablet Take one tablet on Tuesdays and Saturdays  . ipratropium-albuterol (DUONEB) 0.5-2.5 (3) MG/3ML SOLN 4 (four) times daily.  . metoprolol tartrate (LOPRESSOR) 25 MG tablet Take 12.5 mg by mouth 2 (two) times daily.   . pantoprazole (PROTONIX) 40 MG tablet Take by mouth daily.  . potassium chloride SA (KLOR-CON M20) 20 MEQ tablet Take one tablet on Tuesdays and Saturdays.     Allergies:   Topiramate and Phentermine   Social History   Socioeconomic History  . Marital status: Married    Spouse name: Not on file  . Number of children: Not on file  . Years of education: Not on file  . Highest education level: Not on file  Occupational History  . Not on file  Tobacco Use  . Smoking status: Former Research scientist (life sciences)  . Smokeless tobacco: Never Used  Vaping Use  . Vaping Use: Never used  Substance  and Sexual Activity  . Alcohol use: Not Currently  . Drug use: Never  . Sexual activity: Not on file  Other Topics Concern  . Not on file  Social History Narrative  . Not on file   Social Determinants of Health   Financial Resource Strain:   . Difficulty of Paying Living Expenses:   Food Insecurity:   . Worried About Charity fundraiser in the Last Year:   . Arboriculturist in the Last Year:   Transportation Needs:   . Film/video editor (Medical):   Marland Kitchen Lack of Transportation (Non-Medical):   Physical Activity:   . Days of Exercise per Week:   . Minutes of Exercise per Session:   Stress:   . Feeling of Stress :     Social Connections:   . Frequency of Communication with Friends and Family:   . Frequency of Social Gatherings with Friends and Family:   . Attends Religious Services:   . Active Member of Clubs or Organizations:   . Attends Archivist Meetings:   Marland Kitchen Marital Status:      Family History: The patient's family history includes Aneurysm in her sister; Diabetes in her mother; Emphysema in her brother and father; Heart attack in her father; Hyperlipidemia in her mother; Hypertension in her mother; Stomach cancer in her mother; Stroke in her mother.  ROS:   Review of Systems  Constitution: Negative for decreased appetite, fever and weight gain.  HENT: Negative for congestion, ear discharge, hoarse voice and sore throat.   Eyes: Negative for discharge, redness, vision loss in right eye and visual halos.  Cardiovascular: Negative for chest pain, dyspnea on exertion, leg swelling, orthopnea and palpitations.  Respiratory: Negative for cough, hemoptysis, shortness of breath and snoring.   Endocrine: Negative for heat intolerance and polyphagia.  Hematologic/Lymphatic: Negative for bleeding problem. Does not bruise/bleed easily.  Skin: Negative for flushing, nail changes, rash and suspicious lesions.  Musculoskeletal: Negative for arthritis, joint pain, muscle cramps, myalgias, neck pain and stiffness.  Gastrointestinal: Negative for abdominal pain, bowel incontinence, diarrhea and excessive appetite.  Genitourinary: Negative for decreased libido, genital sores and incomplete emptying.  Neurological: Negative for brief paralysis, focal weakness, headaches and loss of balance.  Psychiatric/Behavioral: Negative for altered mental status, depression and suicidal ideas.  Allergic/Immunologic: Negative for HIV exposure and persistent infections.    EKGs/Labs/Other Studies Reviewed:    The following studies were reviewed today:   EKG:    None today  CCTAIMPRESSION: 1. Coronary calcium  score of 86. This was 1 percentile for age and sex matched control. 2. Normal coronary origin with Left dominance. 3. Moderate CAD in the mid LCx. CADRADS 3. This study will be sent for FFR.  CT FFRanalysis 1. Left Main: 0.96  2. LAD: 0.92, 0.83, 0.78 (at the distal end)  3. LCX:0.90, 0.86  4. RCA: 0.89  IMPRESSION: FFR ct significant stenosis (0.78) at the distal end of the LAD as it tapers off. This is a small area. Recommend aggressive medical therapy before attempt for revascularization. No other evidence of significant stenosis or flow limiting lesion.  TTEIMPRESSIONS January 11, 20211. Left ventricular ejection fraction, by visual estimation, is 55 to  60%. The left ventricle has normal function. There is no left ventricular hypertrophy.  2. Left ventricular diastolic parameters are consistent with Grade I diastolic dysfunction (impaired relaxation).  3. The left ventricle has no regional wall motion abnormalities.  4. The mitral valve is normal in  structure. No evidence of mitral valve regurgitation. No evidence of mitral stenosis.  5. The tricuspid valve is normal in structure.    Recent Labs: 10/06/2019: BUN 12; Creatinine, Ser 0.89; Magnesium 2.0; Potassium 4.7; Sodium 144  Recent Lipid Panel    Component Value Date/Time   CHOL 170 04/25/2019 1013   TRIG 142 04/25/2019 1013   HDL 54 04/25/2019 1013   CHOLHDL 3.1 04/25/2019 1013   LDLCALC 91 04/25/2019 1013    Physical Exam:    VS:  BP (!) 130/86 (BP Location: Right Arm, Patient Position: Sitting, Cuff Size: Normal)   Pulse 84   Ht 5\' 5"  (1.651 m)   Wt (!) 206 lb (93.4 kg)   SpO2 99%   BMI 34.28 kg/m     Wt Readings from Last 3 Encounters:  01/05/20 (!) 206 lb (93.4 kg)  10/06/19 200 lb (90.7 kg)  09/08/19 204 lb (92.5 kg)     GEN: Well nourished, well developed in no acute distress HEENT: Normal NECK: No JVD; No carotid bruits LYMPHATICS: No lymphadenopathy CARDIAC: S1S2  noted,RRR, no murmurs, rubs, gallops RESPIRATORY:  Clear to auscultation without rales, wheezing or rhonchi  ABDOMEN: Soft, non-tender, non-distended, +bowel sounds, no guarding. EXTREMITIES: No edema, No cyanosis, no clubbing MUSCULOSKELETAL:  No deformity  SKIN: Warm and dry NEUROLOGIC:  Alert and oriented x 3, non-focal PSYCHIATRIC:  Normal affect, good insight  ASSESSMENT:    1. Coronary artery disease involving native coronary artery of native heart without angina pectoris   2. Essential hypertension   3. Mixed hyperlipidemia   4. Chronic respiratory failure with hypoxia (HCC)    PLAN:     1.  From a cardiovascular standpoint the patient appears to be doing well.  States that the shortness of breath is improving but I do suspect that her culprit was that anemia that was not treated.  Now that is being treated she is feeling a lot better.  2.  Continue patient on rosuvastatin and aspirin 81 mg daily.  3.  Hypertension her blood pressure deceptively in the office no changes will be made.  4 hyperlipidemia continue patient her current Crestor regimen.  5. Chronic respiratory failure continue on oxygen per pulmonary and her PCP.  The patient is in agreement with the above plan. The patient left the office in stable condition.  The patient will follow up in 6 months   Medication Adjustments/Labs and Tests Ordered: Current medicines are reviewed at length with the patient today.  Concerns regarding medicines are outlined above.  No orders of the defined types were placed in this encounter.  No orders of the defined types were placed in this encounter.   Patient Instructions  Medication Instructions:  Your physician recommends that you continue on your current medications as directed. Please refer to the Current Medication list given to you today.  *If you need a refill on your cardiac medications before your next appointment, please call your pharmacy*   Lab  Work: None.  If you have labs (blood work) drawn today and your tests are completely normal, you will receive your results only by: Marland Kitchen MyChart Message (if you have MyChart) OR . A paper copy in the mail If you have any lab test that is abnormal or we need to change your treatment, we will call you to review the results.   Testing/Procedures: None   Follow-Up: At [facility], you and your health needs are our priority.  As part of our continuing mission to  provide you with exceptional heart care, we have created designated Provider Care Teams.  These Care Teams include your primary Cardiologist (physician) and Advanced Practice Providers (APPs -  Physician Assistants and Nurse Practitioners) who all work together to provide you with the care you need, when you need it.  We recommend signing up for the patient portal called "MyChart".  Sign up information is provided on this After Visit Summary.  MyChart is used to connect with patients for Virtual Visits (Telemedicine).  Patients are able to view lab/test results, encounter notes, upcoming appointments, etc.  Non-urgent messages can be sent to your provider as well.   To learn more about what you can do with MyChart, go to [email or web address].    Your next appointment:   6 month(s)  The format for your next appointment:   In Person  Provider:   Berniece Salines, DO   Other Instructions      Adopting a Healthy Lifestyle.  Know what a healthy weight is for you (roughly BMI <25) and aim to maintain this   Aim for 7+ servings of fruits and vegetables daily   65-80+ fluid ounces of water or unsweet tea for healthy kidneys   Limit to max 1 drink of alcohol per day; avoid smoking/tobacco   Limit animal fats in diet for cholesterol and heart health - choose grass fed whenever available   Avoid highly processed foods, and foods high in saturated/trans fats   Aim for low stress - take time to unwind and care for your mental  health   Aim for 150 min of moderate intensity exercise weekly for heart health, and weights twice weekly for bone health   Aim for 7-9 hours of sleep daily   When it comes to diets, agreement about the perfect plan isnt easy to find, even among the experts. Experts at the [employer] developed an idea known as the Healthy Eating Plate. Just imagine a plate divided into logical, healthy portions.   The emphasis is on diet quality:   Load up on vegetables and fruits - one-half of your plate: Aim for color and variety, and remember that potatoes dont count.   Go for whole grains - one-quarter of your plate: Whole wheat, barley, wheat berries, quinoa, oats, brown rice, and foods made with them. If you want pasta, go with whole wheat pasta.   Protein power - one-quarter of your plate: Fish, chicken, beans, and nuts are all healthy, versatile protein sources. Limit red meat.   The diet, however, does go beyond the plate, offering a few other suggestions.   Use healthy plant oils, such as olive, canola, soy, corn, sunflower and peanut. Check the labels, and avoid partially hydrogenated oil, which have unhealthy trans fats.   If youre thirsty, drink water. Coffee and tea are good in moderation, but skip sugary drinks and limit milk and dairy products to one or two daily servings.   The type of carbohydrate in the diet is more important than the amount. Some sources of carbohydrates, such as vegetables, fruits, whole grains, and beans-are healthier than others.   Finally, stay active  Signed, Berniece Salines, DO  01/05/2020 1:15 PM    [facility]

## 2020-01-05 NOTE — Patient Instructions (Signed)

## 2020-03-20 ENCOUNTER — Other Ambulatory Visit: Payer: Self-pay | Admitting: Hematology and Oncology

## 2020-03-20 ENCOUNTER — Encounter (INDEPENDENT_AMBULATORY_CARE_PROVIDER_SITE_OTHER): Payer: Self-pay

## 2020-03-20 DIAGNOSIS — D649 Anemia, unspecified: Secondary | ICD-10-CM | POA: Diagnosis not present

## 2020-03-20 DIAGNOSIS — D72829 Elevated white blood cell count, unspecified: Secondary | ICD-10-CM | POA: Diagnosis not present

## 2020-03-20 LAB — CBC AND DIFFERENTIAL
HCT: 41 (ref 36–46)
Hemoglobin: 13.3 (ref 12.0–16.0)
Platelets: 313 (ref 150–399)
WBC: 10.4

## 2020-03-20 LAB — CBC: RBC: 4.65 (ref 3.87–5.11)

## 2020-03-20 LAB — IRON,TIBC AND FERRITIN PANEL
%SAT: 22.1
Ferritin: 97.5
Iron: 59
TIBC: 266

## 2020-05-29 ENCOUNTER — Other Ambulatory Visit: Payer: Self-pay | Admitting: Cardiology

## 2020-06-20 ENCOUNTER — Other Ambulatory Visit: Payer: Self-pay

## 2020-06-20 MED ORDER — FUROSEMIDE 20 MG PO TABS: ORAL_TABLET | ORAL | 0 refills | Status: DC

## 2020-06-20 NOTE — Telephone Encounter (Signed)
Lasix refill sent to her pharmacy.

## 2020-07-03 ENCOUNTER — Other Ambulatory Visit: Payer: Self-pay

## 2020-07-04 ENCOUNTER — Ambulatory Visit: Payer: Medicare Other | Admitting: Cardiology

## 2020-07-04 NOTE — Progress Notes (Deleted)
Cardiology Office Note:    Date:  07/04/2020   ID:  Carol Cummings, DOB 05-11-1966, MRN [id number]  PCP:  [facility]  Cardiologist:  Berniece Salines, DO  Electrophysiologist:  None   Referring MD: [facility]*    History of Present Illness:    Carol Cummings is a 55 y.o. 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  At her last visit we discussed her CCTA- medical management was preferred. She was actually doing well.   Past Medical History:  Diagnosis Date  . Emphysema lung ([place])   . Hypertension   . Oxygen dependent    2-6 L    Past Surgical History:  Procedure Laterality Date  . BLADDER SUSPENSION    . large intestine surgery    . LUNG LOBECTOMY     both upper lobes  . NASAL SEPTUM SURGERY    . TUBAL LIGATION      Current Medications: No outpatient medications have been marked as taking for the 07/04/20 encounter (Appointment) with Berniece Salines, DO.     Allergies:   Topiramate and Phentermine   Social History   Socioeconomic History  . Marital status: Married    Spouse name: Not on file  . Number of children: Not on file  . Years of education: Not on file  . Highest education level: Not on file  Occupational History  . Not on file  Tobacco Use  . Smoking status: Former Research scientist (life sciences)  . Smokeless tobacco: Never Used  Vaping Use  . Vaping Use: Never used  Substance and Sexual Activity  . Alcohol use: Not Currently  . Drug use: Never  . Sexual activity: Not on file  Other Topics Concern  . Not on file  Social History Narrative  . Not on file   Social Determinants of Health   Financial Resource Strain: Not on file  Food Insecurity: Not on file   Transportation Needs: Not on file  Physical Activity: Not on file  Stress: Not on file  Social Connections: Not on file     Family History: The patient's family history includes Aneurysm in her sister; Diabetes in her mother; Emphysema in her brother and father; Heart attack in her father; Hyperlipidemia in her mother; Hypertension in her mother; Stomach cancer in her mother; Stroke in her mother.  ROS:   Review of Systems  Constitution: Negative for decreased appetite, fever and weight gain.  HENT: Negative for congestion, ear discharge, hoarse voice and sore throat.   Eyes: Negative for discharge, redness, vision loss in right eye and visual halos.  Cardiovascular: Negative for chest pain, dyspnea on exertion, leg swelling, orthopnea and palpitations.  Respiratory: Negative for cough, hemoptysis, shortness of breath and snoring.   Endocrine: Negative for heat intolerance and polyphagia.  Hematologic/Lymphatic: Negative for bleeding problem. Does not bruise/bleed easily.  Skin: Negative for flushing, nail changes, rash and suspicious lesions.  Musculoskeletal: Negative for arthritis, joint pain, muscle cramps, myalgias, neck pain and stiffness.  Gastrointestinal: Negative for abdominal pain, bowel incontinence, diarrhea and excessive appetite.  Genitourinary: Negative for decreased libido, genital sores and incomplete emptying.  Neurological: Negative for brief paralysis, focal weakness, headaches and loss of balance.  Psychiatric/Behavioral: Negative for altered mental status, depression and  suicidal ideas.  Allergic/Immunologic: Negative for HIV exposure and persistent infections.    EKGs/Labs/Other Studies Reviewed:    The following studies were reviewed today:   EKG:  The ekg ordered today demonstrates   CCTAIMPRESSION: 1. Coronary calcium score of 86. This was 79 percentile for age and sex matched control. 2. Normal coronary origin with Left dominance. 3. Moderate CAD  in the mid LCx. CADRADS 3. This study will be sent for FFR.  CT FFRanalysis 1. Left Main: 0.96  2. LAD: 0.92, 0.83, 0.78 (at the distal end)  3. LCX:0.90, 0.86  4. RCA: 0.89  IMPRESSION: FFR ct significant stenosis (0.78) at the distal end of the LAD as it tapers off. This is a small area. Recommend aggressive medical therapy before attempt for revascularization. No other evidence of significant stenosis or flow limiting lesion.  TTEIMPRESSIONS January 11, 20211. Left ventricular ejection fraction, by visual estimation, is 55 to  60%. The left ventricle has normal function. There is no left ventricular hypertrophy.  2. Left ventricular diastolic parameters are consistent with Grade I diastolic dysfunction (impaired relaxation).  3. The left ventricle has no regional wall motion abnormalities.  4. The mitral valve is normal in structure. No evidence of mitral valve regurgitation. No evidence of mitral stenosis.  5. The tricuspid valve is normal in structure.    Recent Labs: 10/06/2019: BUN 12; Creatinine, Ser 0.89; Magnesium 2.0; Potassium 4.7; Sodium 144 03/20/2020: Hemoglobin 13.3; Platelets 313  Recent Lipid Panel    Component Value Date/Time   CHOL 170 04/25/2019 1013   TRIG 142 04/25/2019 1013   HDL 54 04/25/2019 1013   CHOLHDL 3.1 04/25/2019 1013   LDLCALC 91 04/25/2019 1013    Physical Exam:    VS:  There were no vitals taken for this visit.    Wt Readings from Last 3 Encounters:  01/05/20 (!) 206 lb (93.4 kg)  10/06/19 200 lb (90.7 kg)  09/08/19 204 lb (92.5 kg)     GEN: Well nourished, well developed in no acute distress HEENT: Normal NECK: No JVD; No carotid bruits LYMPHATICS: No lymphadenopathy CARDIAC: S1S2 noted,RRR, no murmurs, rubs, gallops RESPIRATORY:  Clear to auscultation without rales, wheezing or rhonchi  ABDOMEN: Soft, non-tender, non-distended, +bowel sounds, no guarding. EXTREMITIES: No edema, No cyanosis, no  clubbing MUSCULOSKELETAL:  No deformity  SKIN: Warm and dry NEUROLOGIC:  Alert and oriented x 3, non-focal PSYCHIATRIC:  Normal affect, good insight  ASSESSMENT:    No diagnosis found. PLAN:     1.  The patient is in agreement with the above plan. The patient left the office in stable condition.  The patient will follow up in   Medication Adjustments/Labs and Tests Ordered: Current medicines are reviewed at length with the patient today.  Concerns regarding medicines are outlined above.  No orders of the defined types were placed in this encounter.  No orders of the defined types were placed in this encounter.   There are no Patient Instructions on file for this visit.   Adopting a Healthy Lifestyle.  Know what a healthy weight is for you (roughly BMI <25) and aim to maintain this   Aim for 7+ servings of fruits and vegetables daily   65-80+ fluid ounces of water or unsweet tea for healthy kidneys   Limit to max 1 drink of alcohol per day; avoid smoking/tobacco   Limit animal fats in diet for cholesterol and heart health - choose grass fed whenever available   Avoid highly processed foods,  and foods high in saturated/trans fats   Aim for low stress - take time to unwind and care for your mental health   Aim for 150 min of moderate intensity exercise weekly for heart health, and weights twice weekly for bone health   Aim for 7-9 hours of sleep daily   When it comes to diets, agreement about the perfect plan isnt easy to find, even among the experts. Experts at the [place] developed an idea known as the Healthy Eating Plate. Just imagine a plate divided into logical, healthy portions.   The emphasis is on diet quality:   Load up on vegetables and fruits - one-half of your plate: Aim for color and variety, and remember that potatoes dont count.   Go for whole grains - one-quarter of your plate: Whole wheat, barley, wheat berries, quinoa,  oats, brown rice, and foods made with them. If you want pasta, go with whole wheat pasta.   Protein power - one-quarter of your plate: Fish, chicken, beans, and nuts are all healthy, versatile protein sources. Limit red meat.   The diet, however, does go beyond the plate, offering a few other suggestions.   Use healthy plant oils, such as olive, canola, soy, corn, sunflower and peanut. Check the labels, and avoid partially hydrogenated oil, which have unhealthy trans fats.   If youre thirsty, drink water. Coffee and tea are good in moderation, but skip sugary drinks and limit milk and dairy products to one or two daily servings.   The type of carbohydrate in the diet is more important than the amount. Some sources of carbohydrates, such as vegetables, fruits, whole grains, and beans-are healthier than others.   Finally, stay active  Signed, Berniece Salines, DO  07/04/2020 10:40 AM    [place]

## 2020-08-19 IMAGING — CT CT HEART MORP W/ CTA COR W/ SCORE W/ CA W/CM &/OR W/O CM
3 of 5 series · 12 of 20 positions shown, 13 images · IV contrast (APPLIED)
Comparison: 01/27/2019
COMPARISON: 01/27/2019

Addendum:
EXAM:
OVER-READ INTERPRETATION  CT CHEST

The following report is an over-read performed by radiologist Dr.
Ulvi Umud Filtirleri [REDACTED] on 08/24/2019. This over-read
does not include interpretation of cardiac or coronary anatomy or
pathology. The coronary CTA interpretation by the cardiologist is
attached.
CLINICAL DATA: 54 year old female with Dyspnea. Medical history
includes Hyperlipidemia.
Cardiac/Coronary  CT
TECHNIQUE: The patient was scanned on a [employer] Force scanner.

[Series 6: best diast 36 % · axial · 0.39mm/px · z∈[-398,-320]mm · 4 of 323 slices shown, 5 images]
[im 65/323  vessel]
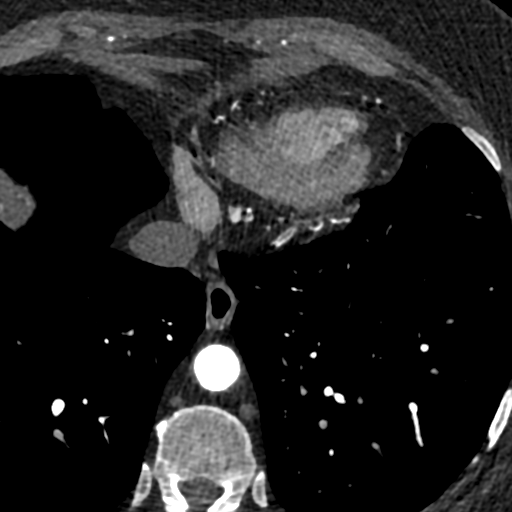
[im 65/323  lung]
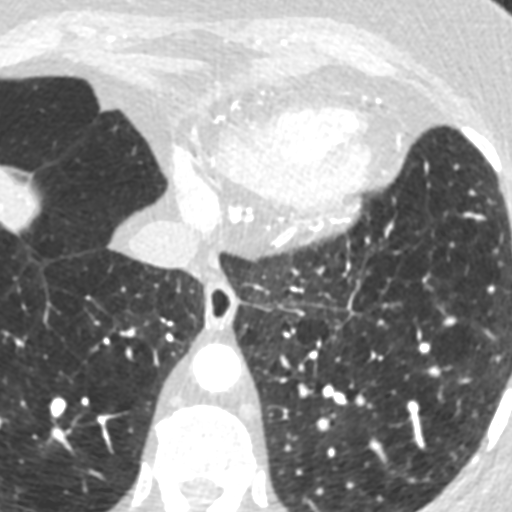
[im 129/323  vessel]
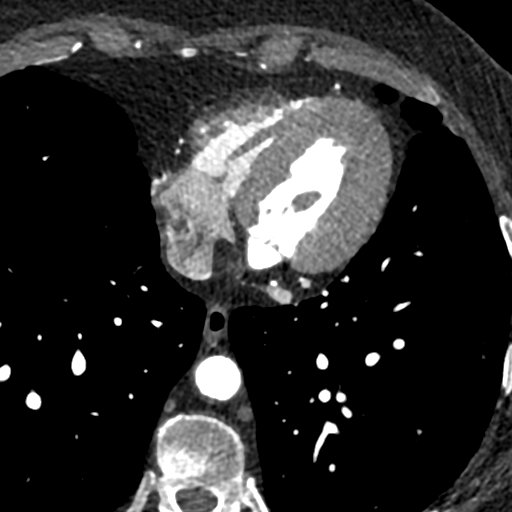
[im 194/323  vessel]
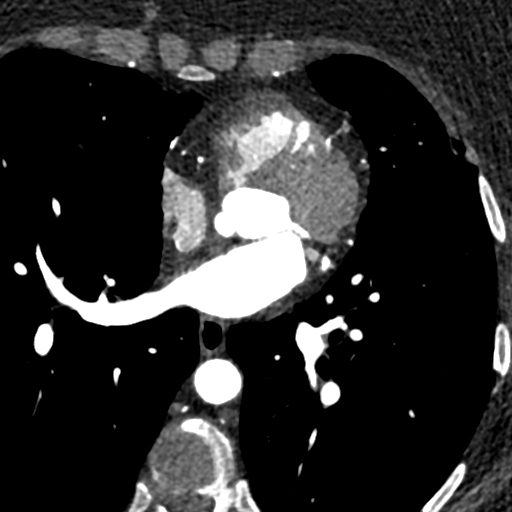
[im 258/323  vessel]
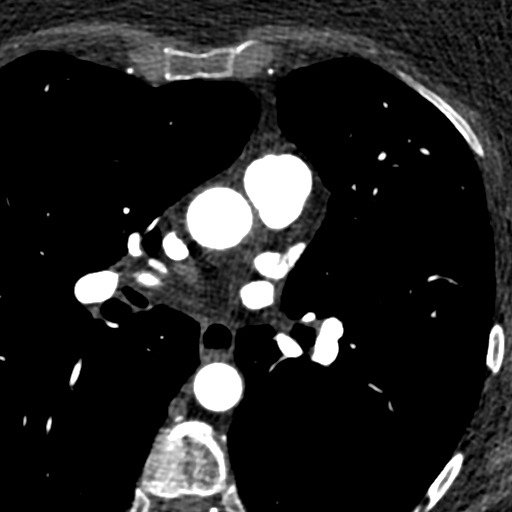

[Series 7: best syst 36 % · axial · 0.39mm/px · z∈[-398,-320]mm · 4 of 323 slices shown]
[im 65/323  vessel]
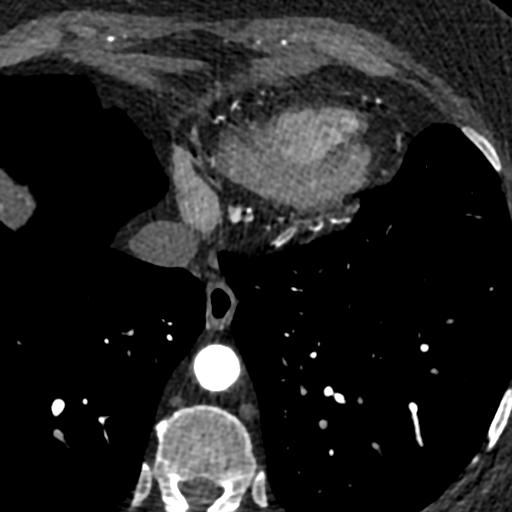
[im 129/323  vessel]
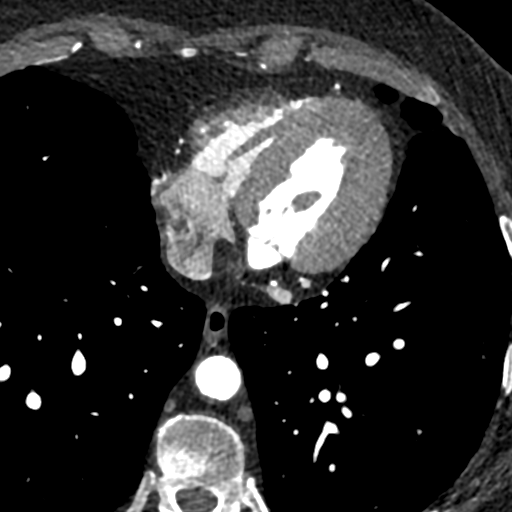
[im 194/323  vessel]
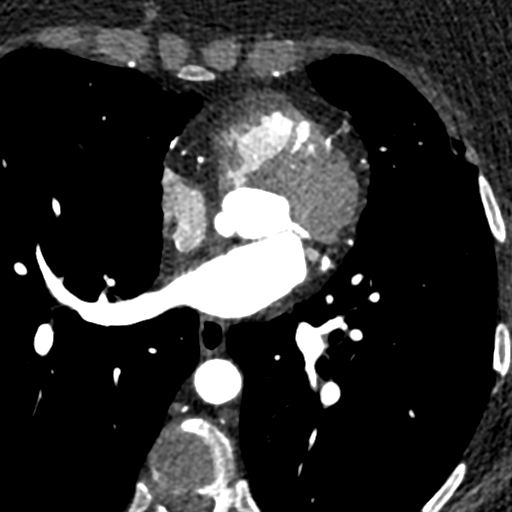
[im 258/323  vessel]
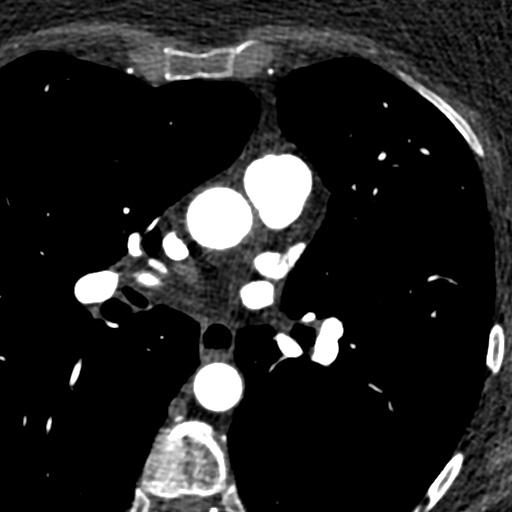

[Series 8: ts diast sharp 36 % · axial · 0.39mm/px · z∈[-398,-320]mm · 4 of 323 slices shown]
[im 65/323  lung]
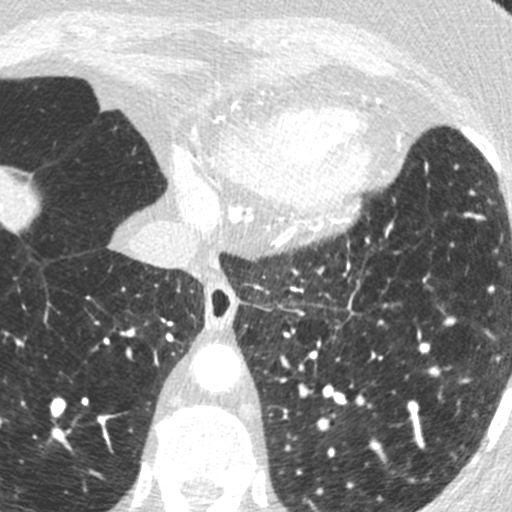
[im 129/323  lung]
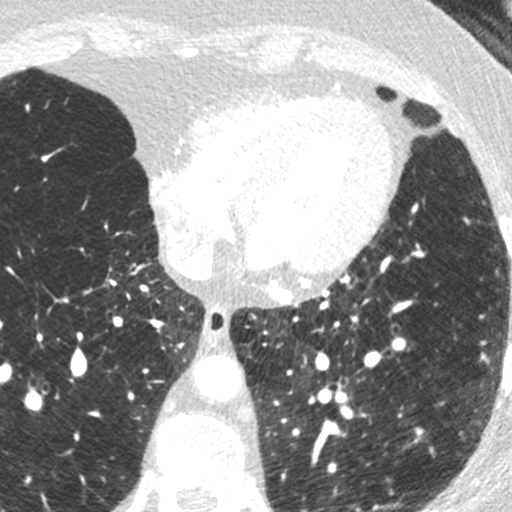
[im 194/323  lung]
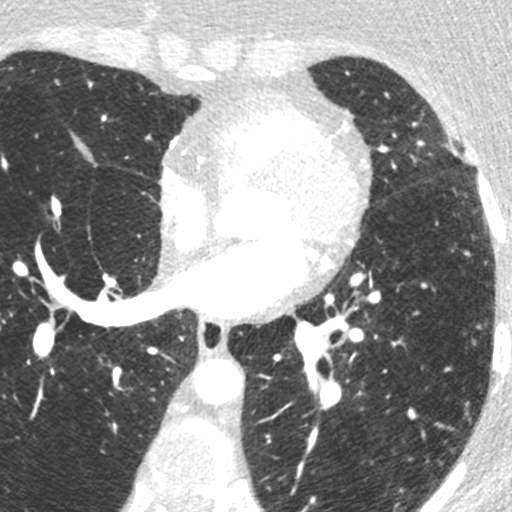
[im 258/323  lung]
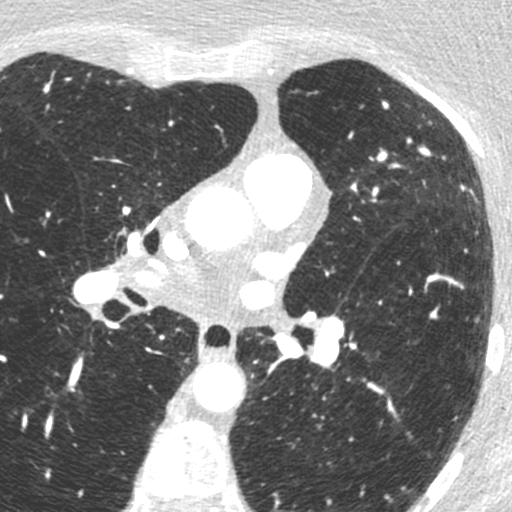

[12 of 20 positions shown; findings below may reference images not displayed]

FINDINGS: Vascular: Heart is normal size.  Aorta is normal caliber.

Mediastinum/Nodes: No adenopathy in the lower mediastinum or hila.

Lungs/Pleura: Emphysema. Scarring in the right middle lobe and
lingula. No effusions.

Upper Abdomen: Imaging into the upper abdomen shows no acute
findings.

Musculoskeletal: Chest wall soft tissues are unremarkable. No acute
bony abnormality.
IMPRESSION: Emphysema/chronic changes.

No acute extra cardiac abnormality.
FINDINGS: A 120 kV prospective scan was triggered in the descending thoracic
aorta at 111 HU's. Axial non-contrast 3 mm slices were carried out
through the heart. The data set was analyzed on a dedicated work
station and scored using the Agatson method. Gantry rotation speed
was 250 msecs and collimation was .6 mm. No beta blockade and 0.8 mg
of sl NTG was given. The 3D data set was reconstructed in 5%
intervals of the 67-82 % of the R-R cycle. Diastolic phases were
analyzed on a dedicated work station using MPR, MIP and VRT modes.
The patient received 80 cc of contrast.

Aorta: Normal size.  No calcifications.  No dissection.

Aortic Valve:  Trileaflet.  No calcifications.

Coronary Arteries:  Normal coronary origin.  Left dominance.

RCA is a small non-dominant artery. There is no plaque.

Left main is a large artery that gives rise to LAD and LCX arteries.
This is minimal (<24%) calcification in the distal left main.

LAD is a large vessel. There is a mild (25-49%) calcified plaque in
the mid portion of the vessel.

LCX is a dominant artery that gives rise to one large OM1 branch.
There is a moderate (50-69%) calcified plaque in the mid portion of
the vessel.

Other findings:

Normal pulmonary vein drainage into the left atrium.

Normal left atrial appendage without a thrombus.

Normal size of the pulmonary artery.
IMPRESSION: 1. Coronary calcium score of 86. This was 96 percentile for age and
sex matched control.

2. Normal coronary origin with Left dominance.

3. Moderate CAD in the mid LCx. CADRADS 3. This study will be sent
for FFR.

Tanhaye Anastasi, DO

*** End of Addendum ***
EXAM:
OVER-READ INTERPRETATION  CT CHEST

The following report is an over-read performed by radiologist Dr.
Ulvi Umud Filtirleri [REDACTED] on 08/24/2019. This over-read
does not include interpretation of cardiac or coronary anatomy or
pathology. The coronary CTA interpretation by the cardiologist is
attached.
FINDINGS: Vascular: Heart is normal size.  Aorta is normal caliber.

Mediastinum/Nodes: No adenopathy in the lower mediastinum or hila.

Lungs/Pleura: Emphysema. Scarring in the right middle lobe and
lingula. No effusions.

Upper Abdomen: Imaging into the upper abdomen shows no acute
findings.

Musculoskeletal: Chest wall soft tissues are unremarkable. No acute
bony abnormality.
IMPRESSION: Emphysema/chronic changes.

No acute extra cardiac abnormality.

## 2020-09-04 DIAGNOSIS — Z9981 Dependence on supplemental oxygen: Secondary | ICD-10-CM | POA: Insufficient documentation

## 2020-09-06 ENCOUNTER — Encounter: Payer: Self-pay | Admitting: Cardiology

## 2020-09-06 ENCOUNTER — Ambulatory Visit (INDEPENDENT_AMBULATORY_CARE_PROVIDER_SITE_OTHER): Payer: Medicare Other | Admitting: Cardiology

## 2020-09-06 ENCOUNTER — Other Ambulatory Visit: Payer: Self-pay

## 2020-09-06 VITALS — BP 120/76 | HR 90 | Ht 65.0 in | Wt 198.0 lb

## 2020-09-06 DIAGNOSIS — E782 Mixed hyperlipidemia: Secondary | ICD-10-CM

## 2020-09-06 DIAGNOSIS — I10 Essential (primary) hypertension: Secondary | ICD-10-CM

## 2020-09-06 DIAGNOSIS — E669 Obesity, unspecified: Secondary | ICD-10-CM

## 2020-09-06 DIAGNOSIS — I2510 Atherosclerotic heart disease of native coronary artery without angina pectoris: Secondary | ICD-10-CM

## 2020-09-06 DIAGNOSIS — J9611 Chronic respiratory failure with hypoxia: Secondary | ICD-10-CM | POA: Diagnosis not present

## 2020-09-06 HISTORY — DX: Obesity, unspecified: E66.9

## 2020-09-06 NOTE — Patient Instructions (Signed)
Medication Instructions:  Your physician recommends that you continue on your current medications as directed. Please refer to the Current Medication list given to you today.  *If you need a refill on your cardiac medications before your next appointment, please call your pharmacy*   Lab Work: Your physician recommends that you return for lab work: TODAY: BMET, Mag, CBC If you have labs (blood work) drawn today and your tests are completely normal, you will receive your results only by: Marland Kitchen MyChart Message (if you have MyChart) OR . A paper copy in the mail If you have any lab test that is abnormal or we need to change your treatment, we will call you to review the results.   Testing/Procedures: None   Follow-Up: At [facility], you and your health needs are our priority.  As part of our continuing mission to provide you with exceptional heart care, we have created designated Provider Care Teams.  These Care Teams include your primary Cardiologist (physician) and Advanced Practice Providers (APPs -  Physician Assistants and Nurse Practitioners) who all work together to provide you with the care you need, when you need it.  We recommend signing up for the patient portal called "MyChart".  Sign up information is provided on this After Visit Summary.  MyChart is used to connect with patients for Virtual Visits (Telemedicine).  Patients are able to view lab/test results, encounter notes, upcoming appointments, etc.  Non-urgent messages can be sent to your provider as well.   To learn more about what you can do with MyChart, go to [email or web address].    Your next appointment:   1 year(s)  The format for your next appointment:   In Person  Provider:   Berniece Salines, DO   Other Instructions

## 2020-09-06 NOTE — Progress Notes (Signed)
Cardiology Office Note:    Date:  09/06/2020   ID:  Carol Cummings, DOB Nov 02, 1965, MRN [id number]  PCP:  [facility]  Cardiologist:  Berniece Salines, DO  Electrophysiologist:  None   Referring MD: [facility]*   I am doing fine  History of Present Illness:    Carol Cummings is a 55 y.o. 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  Last saw the patient in July 2021 at that time she is doing well from a cardiovascular standpoint.  She is really happy with where she is she is lost some weight.  She tells me that she is doing more exercises.  Past Medical History:  Diagnosis Date  . Emphysema lung ([place])   . Hypertension   . Oxygen dependent    2-6 L    Past Surgical History:  Procedure Laterality Date  . BLADDER SUSPENSION    . large intestine surgery    . LUNG LOBECTOMY     both upper lobes  . NASAL SEPTUM SURGERY    . TUBAL LIGATION      Current Medications: Current Meds  Medication Sig  . acetaminophen (TYLENOL) 500 MG tablet Take 500 mg by mouth every 6 (six) hours as needed for mild pain or moderate pain.  Marland Kitchen albuterol (PROVENTIL) (2.5 MG/3ML) 0.083% nebulizer solution Take 2.5 mg by nebulization 4 (four) times daily.  Marland Kitchen albuterol (VENTOLIN HFA) 108 (90 Base) MCG/ACT inhaler Inhale into the lungs 3 (three) times daily as needed for wheezing or shortness of breath.  . ALPRAZolam (XANAX) 1 MG tablet 1 mg 2 (two) times daily.  Marland Kitchen aspirin EC 81 MG tablet Take 1 tablet (81 mg total) by mouth daily.  Marland Kitchen azelastine (ASTELIN) 0.1 % nasal spray Place 1 spray into both nostrils 2 (two) times daily.  Marland Kitchen azithromycin (ZITHROMAX) 250 MG tablet Take 250 mg by mouth 3 (three)  times a week.  . budesonide (PULMICORT) 0.5 MG/2ML nebulizer solution Take 0.5 mg by nebulization 2 (two) times daily.  Marland Kitchen buPROPion (WELLBUTRIN SR) 150 MG 12 hr tablet Take 150 mg by mouth 2 (two) times daily.  Marland Kitchen dexamethasone (DECADRON) 6 MG tablet Take 6 mg by mouth 2 (two) times daily.  . Fluticasone-Umeclidin-Vilant (TRELEGY ELLIPTA) 200-62.5-25 MCG/INH AEPB Inhale 25 mcg into the lungs daily.  . furosemide (LASIX) 20 MG tablet Take one tablet on Tuesdays and Saturdays  . guaiFENesin (MUCINEX) 600 MG 12 hr tablet Take 600 mg by mouth as needed for cough or to loosen phlegm. For cough  . ipratropium-albuterol (DUONEB) 0.5-2.5 (3) MG/3ML SOLN Inhale 3 mLs into the lungs 4 (four) times daily.  Marland Kitchen levofloxacin (LEVAQUIN) 500 MG tablet Take 500 mg by mouth daily.  . metoprolol tartrate (LOPRESSOR) 25 MG tablet Take 12.5 mg by mouth 2 (two) times daily.   . pantoprazole (PROTONIX) 40 MG tablet Take by mouth daily.  . potassium chloride SA (KLOR-CON M20) 20 MEQ tablet Take one tablet on Tuesdays and Saturdays.  . rosuvastatin (CRESTOR) 10 MG tablet Take 10 mg by mouth at bedtime.  . sulfamethoxazole-trimethoprim (BACTRIM DS) 800-160 MG tablet Take 1 tablet by mouth 2 (two) times daily.     Allergies:   Topiramate and Phentermine   Social History  Socioeconomic History  . Marital status: Married    Spouse name: Not on file  . Number of children: Not on file  . Years of education: Not on file  . Highest education level: Not on file  Occupational History  . Not on file  Tobacco Use  . Smoking status: Former Research scientist (life sciences)  . Smokeless tobacco: Never Used  Vaping Use  . Vaping Use: Never used  Substance and Sexual Activity  . Alcohol use: Not Currently  . Drug use: Never  . Sexual activity: Not on file  Other Topics Concern  . Not on file  Social History Narrative  . Not on file   Social Determinants of Health   Financial Resource Strain: Not on file  Food Insecurity: Not on file   Transportation Needs: Not on file  Physical Activity: Not on file  Stress: Not on file  Social Connections: Not on file     Family History: The patient's family history includes Aneurysm in her sister; Diabetes in her mother; Emphysema in her brother and father; Heart attack in her father; Hyperlipidemia in her mother; Hypertension in her mother; Stomach cancer in her mother; Stroke in her mother.  ROS:   Review of Systems  Constitution: Negative for decreased appetite, fever and weight gain.  HENT: Negative for congestion, ear discharge, hoarse voice and sore throat.   Eyes: Negative for discharge, redness, vision loss in right eye and visual halos.  Cardiovascular: Negative for chest pain, dyspnea on exertion, leg swelling, orthopnea and palpitations.  Respiratory: Negative for cough, hemoptysis, shortness of breath and snoring.   Endocrine: Negative for heat intolerance and polyphagia.  Hematologic/Lymphatic: Negative for bleeding problem. Does not bruise/bleed easily.  Skin: Negative for flushing, nail changes, rash and suspicious lesions.  Musculoskeletal: Negative for arthritis, joint pain, muscle cramps, myalgias, neck pain and stiffness.  Gastrointestinal: Negative for abdominal pain, bowel incontinence, diarrhea and excessive appetite.  Genitourinary: Negative for decreased libido, genital sores and incomplete emptying.  Neurological: Negative for brief paralysis, focal weakness, headaches and loss of balance.  Psychiatric/Behavioral: Negative for altered mental status, depression and suicidal ideas.  Allergic/Immunologic: Negative for HIV exposure and persistent infections.    EKGs/Labs/Other Studies Reviewed:    The following studies were reviewed today:   EKG: None today  CCTAIMPRESSION: 1. Coronary calcium score of 86. This was 78 percentile for age and sex matched control. 2. Normal coronary origin with Left dominance. 3. Moderate CAD in the mid LCx. CADRADS 3.  This study will be sent for FFR.  CT FFRanalysis 1. Left Main: 0.96  2. LAD: 0.92, 0.83, 0.78 (at the distal end)  3. LCX:0.90, 0.86  4. RCA: 0.89  IMPRESSION: FFR ct significant stenosis (0.78) at the distal end of the LAD as it tapers off. This is a small area. Recommend aggressive medical therapy before attempt for revascularization. No other evidence of significant stenosis or flow limiting lesion.  TTEIMPRESSIONS January 11, 20211. Left ventricular ejection fraction, by visual estimation, is 55 to  60%. The left ventricle has normal function. There is no left ventricular hypertrophy.  2. Left ventricular diastolic parameters are consistent with Grade I diastolic dysfunction (impaired relaxation).  3. The left ventricle has no regional wall motion abnormalities.  4. The mitral valve is normal in structure. No evidence of mitral valve regurgitation. No evidence of mitral stenosis.  5. The tricuspid valve is normal in structure.  Recent Labs: 10/06/2019: BUN 12; Creatinine, Ser 0.89; Magnesium 2.0; Potassium 4.7; Sodium 144 03/20/2020:  Hemoglobin 13.3; Platelets 313  Recent Lipid Panel    Component Value Date/Time   CHOL 170 04/25/2019 1013   TRIG 142 04/25/2019 1013   HDL 54 04/25/2019 1013   CHOLHDL 3.1 04/25/2019 1013   LDLCALC 91 04/25/2019 1013    Physical Exam:    VS:  BP 120/76   Pulse 90   Ht 5\' 5"  (1.651 m)   Wt 198 lb (89.8 kg)   SpO2 98%   BMI 32.95 kg/m     Wt Readings from Last 3 Encounters:  09/06/20 198 lb (89.8 kg)  01/05/20 (!) 206 lb (93.4 kg)  10/06/19 200 lb (90.7 kg)     GEN: Well nourished, well developed in no acute distress HEENT: Normal NECK: No JVD; No carotid bruits LYMPHATICS: No lymphadenopathy CARDIAC: S1S2 noted,RRR, no murmurs, rubs, gallops RESPIRATORY:  Clear to auscultation without rales, wheezing or rhonchi  ABDOMEN: Soft, non-tender, non-distended, +bowel sounds, no guarding. EXTREMITIES: No edema, No  cyanosis, no clubbing MUSCULOSKELETAL:  No deformity  SKIN: Warm and dry NEUROLOGIC:  Alert and oriented x 3, non-focal PSYCHIATRIC:  Normal affect, good insight  ASSESSMENT:    1. Coronary artery disease involving native coronary artery of native heart, unspecified whether angina present   2. Essential hypertension   3. Chronic respiratory failure with hypoxia (HCC)   4. Mixed hyperlipidemia   5. Obesity (BMI 30-39.9)    PLAN:    She appears to be doing well from a cardiovascular standpoint.  No anginal symptoms we will continue patient on current medication regimen. Her blood pressure acceptable no changes will be made to her blood pressure regimen. Hyperlipidemia - continue with current statin medication. For chronic respiratory failure continue patient on her oxygen per pulmonary and PCP. The patient understands the need to lose weight with diet and exercise. We have discussed specific strategies for this.  The patient is in agreement with the above plan. The patient left the office in stable condition.  The patient will follow up in 1 year or sooner if needed.  Medication Adjustments/Labs and Tests Ordered: Current medicines are reviewed at length with the patient today.  Concerns regarding medicines are outlined above.  Orders Placed This Encounter  Procedures  . Basic metabolic panel  . CBC with Differential/Platelet  . Magnesium   No orders of the defined types were placed in this encounter.   Patient Instructions  Medication Instructions:  Your physician recommends that you continue on your current medications as directed. Please refer to the Current Medication list given to you today.  *If you need a refill on your cardiac medications before your next appointment, please call your pharmacy*   Lab Work: Your physician recommends that you return for lab work: TODAY: BMET, Mag, CBC If you have labs (blood work) drawn today and your tests are completely normal, you  will receive your results only by: Marland Kitchen MyChart Message (if you have MyChart) OR . A paper copy in the mail If you have any lab test that is abnormal or we need to change your treatment, we will call you to review the results.   Testing/Procedures: None   Follow-Up: At [facility], you and your health needs are our priority.  As part of our continuing mission to provide you with exceptional heart care, we have created designated Provider Care Teams.  These Care Teams include your primary Cardiologist (physician) and Advanced Practice Providers (APPs -  Physician Assistants and Nurse Practitioners) who all work together to provide  you with the care you need, when you need it.  We recommend signing up for the patient portal called "MyChart".  Sign up information is provided on this After Visit Summary.  MyChart is used to connect with patients for Virtual Visits (Telemedicine).  Patients are able to view lab/test results, encounter notes, upcoming appointments, etc.  Non-urgent messages can be sent to your provider as well.   To learn more about what you can do with MyChart, go to [email or web address].    Your next appointment:   1 year(s)  The format for your next appointment:   In Person  Provider:   Berniece Salines, DO   Other Instructions      Adopting a Healthy Lifestyle.  Know what a healthy weight is for you (roughly BMI <25) and aim to maintain this   Aim for 7+ servings of fruits and vegetables daily   65-80+ fluid ounces of water or unsweet tea for healthy kidneys   Limit to max 1 drink of alcohol per day; avoid smoking/tobacco   Limit animal fats in diet for cholesterol and heart health - choose grass fed whenever available   Avoid highly processed foods, and foods high in saturated/trans fats   Aim for low stress - take time to unwind and care for your mental health   Aim for 150 min of moderate intensity exercise weekly for heart health, and weights twice  weekly for bone health   Aim for 7-9 hours of sleep daily   When it comes to diets, agreement about the perfect plan isnt easy to find, even among the experts. Experts at the [employer] developed an idea known as the Healthy Eating Plate. Just imagine a plate divided into logical, healthy portions.   The emphasis is on diet quality:   Load up on vegetables and fruits - one-half of your plate: Aim for color and variety, and remember that potatoes dont count.   Go for whole grains - one-quarter of your plate: Whole wheat, barley, wheat berries, quinoa, oats, brown rice, and foods made with them. If you want pasta, go with whole wheat pasta.   Protein power - one-quarter of your plate: Fish, chicken, beans, and nuts are all healthy, versatile protein sources. Limit red meat.   The diet, however, does go beyond the plate, offering a few other suggestions.   Use healthy plant oils, such as olive, canola, soy, corn, sunflower and peanut. Check the labels, and avoid partially hydrogenated oil, which have unhealthy trans fats.   If youre thirsty, drink water. Coffee and tea are good in moderation, but skip sugary drinks and limit milk and dairy products to one or two daily servings.   The type of carbohydrate in the diet is more important than the amount. Some sources of carbohydrates, such as vegetables, fruits, whole grains, and beans-are healthier than others.   Finally, stay active  Signed, Berniece Salines, DO  09/06/2020 1:11 PM    [facility]

## 2020-09-07 LAB — CBC WITH DIFFERENTIAL/PLATELET
Basophils Absolute: 0.1 10*3/uL (ref 0.0–0.2)
Basos: 1 %
EOS (ABSOLUTE): 0.1 10*3/uL (ref 0.0–0.4)
Eos: 1 %
Hematocrit: 41.2 % (ref 34.0–46.6)
Hemoglobin: 13 g/dL (ref 11.1–15.9)
Immature Grans (Abs): 0 10*3/uL (ref 0.0–0.1)
Immature Granulocytes: 0 %
Lymphocytes Absolute: 1.8 10*3/uL (ref 0.7–3.1)
Lymphs: 15 %
MCH: 28 pg (ref 26.6–33.0)
MCHC: 31.6 g/dL (ref 31.5–35.7)
MCV: 89 fL (ref 79–97)
Monocytes Absolute: 0.8 10*3/uL (ref 0.1–0.9)
Monocytes: 7 %
Neutrophils Absolute: 8.7 10*3/uL — ABNORMAL HIGH (ref 1.4–7.0)
Neutrophils: 76 %
Platelets: 347 10*3/uL (ref 150–450)
RBC: 4.65 x10E6/uL (ref 3.77–5.28)
RDW: 12.6 % (ref 11.7–15.4)
WBC: 11.5 10*3/uL — ABNORMAL HIGH (ref 3.4–10.8)

## 2020-09-07 LAB — BASIC METABOLIC PANEL
BUN/Creatinine Ratio: 12 (ref 9–23)
BUN: 10 mg/dL (ref 6–24)
CO2: 25 mmol/L (ref 20–29)
Calcium: 9.3 mg/dL (ref 8.7–10.2)
Chloride: 101 mmol/L (ref 96–106)
Creatinine, Ser: 0.82 mg/dL (ref 0.57–1.00)
Glucose: 93 mg/dL (ref 65–99)
Potassium: 4 mmol/L (ref 3.5–5.2)
Sodium: 142 mmol/L (ref 134–144)
eGFR: 85 mL/min/{1.73_m2} (ref >59)

## 2020-09-07 LAB — MAGNESIUM: Magnesium: 1.8 mg/dL (ref 1.6–2.3)

## 2020-09-09 ENCOUNTER — Telehealth: Payer: Self-pay

## 2020-09-09 ENCOUNTER — Telehealth: Payer: Self-pay | Admitting: Cardiology

## 2020-09-09 NOTE — Telephone Encounter (Signed)
Patient returning call for lab results. 

## 2020-09-09 NOTE — Telephone Encounter (Signed)
Spoke with patient about her lab results. Patient verbalizes understanding. No questions or concerns at this time.

## 2020-09-10 NOTE — Telephone Encounter (Signed)
Spoke with patient, see chart.    

## 2020-09-17 NOTE — Progress Notes (Signed)
[facility]  [address] [place],  [place]  [address] [phone]  Clinic Day:  09/18/2020  Referring physician: [facility]*   HISTORY OF PRESENT ILLNESS:  The patient is a 55 y.o. female with iron deficiency anemia, likely related to her heavy menstrual cycles.  She comes in today to reassess her iron and hemoglobin levels.  In these recent past, IV Feraheme was very effective in replenishing her iron and normalizing her hemoglobin.  Overall, the patient claims to be feeling well.  . Outside of her menstrual cycles, she denies having other overt forms of blood loss since her last visit.  PHYSICAL EXAM:  Blood pressure (!) 142/71, pulse 76, temperature 98.2 F (36.8 C), resp. rate 18, height 5\' 5"  (1.651 m), weight 196 lb 14.4 oz (89.3 kg), SpO2 98 %. Wt Readings from Last 3 Encounters:  09/19/20 196 lb (88.9 kg)  09/18/20 196 lb 14.4 oz (89.3 kg)  09/06/20 198 lb (89.8 kg)   Body mass index is 32.77 kg/m. Performance status (ECOG): 0 Physical Exam Constitutional:      Appearance: Normal appearance. She is not ill-appearing.  HENT:     Mouth/Throat:     Mouth: Mucous membranes are moist.     Pharynx: Oropharynx is clear. No oropharyngeal exudate or posterior oropharyngeal erythema.  Cardiovascular:     Rate and Rhythm: Normal rate and regular rhythm.     Heart sounds: No murmur heard. No friction rub. No gallop.   Pulmonary:     Effort: Pulmonary effort is normal. No respiratory distress.     Breath sounds: Normal breath sounds. No wheezing, rhonchi or rales.  Chest:  Breasts:     Right: No axillary adenopathy or supraclavicular adenopathy.     Left: No axillary adenopathy or supraclavicular adenopathy.    Abdominal:     General: Bowel sounds are normal. There is no distension.     Palpations: Abdomen is soft. There is no mass.     Tenderness: There is no abdominal tenderness.  Musculoskeletal:        General: No  swelling.     Right lower leg: No edema.     Left lower leg: No edema.  Lymphadenopathy:     Cervical: No cervical adenopathy.     Upper Body:     Right upper body: No supraclavicular or axillary adenopathy.     Left upper body: No supraclavicular or axillary adenopathy.     Lower Body: No right inguinal adenopathy. No left inguinal adenopathy.  Skin:    General: Skin is warm.     Coloration: Skin is not jaundiced.     Findings: No lesion or rash.  Neurological:     General: No focal deficit present.     Mental Status: She is alert and oriented to person, place, and time. Mental status is at baseline.     Cranial Nerves: Cranial nerves are intact.  Psychiatric:        Mood and Affect: Mood normal.        Behavior: Behavior normal.        Thought Content: Thought content normal.    LABS:  Results for CAROLANNE, [facility] (MRN [id number]) as of 09/25/2020 16:27  Ref. Range 09/06/2020 13:13  WBC Latest Ref Range: 3.4 - 10.8 x10E3/uL 11.5 (H)  RBC Latest Ref Range: 3.77 - 5.28 x10E6/uL 4.65  Hemoglobin Latest Ref Range: 11.1 - 15.9 g/dL 13.0  HCT Latest Ref Range: 34.0 -  46.6 % 41.2  MCV Latest Ref Range: 79 - 97 fL 89  MCH Latest Ref Range: 26.6 - 33.0 pg 28.0  MCHC Latest Ref Range: 31.5 - 35.7 g/dL 31.6  RDW Latest Ref Range: 11.7 - 15.4 % 12.6  Platelets Latest Ref Range: 150 - 450 x10E3/uL 347   CBC Latest Ref Rng & Units 09/19/2020 09/06/2020 03/20/2020  WBC 4.0 - 10.5 K/uL 9.6 11.5(H) 10.4  Hemoglobin 12.0 - 15.0 g/dL 13.1 13.0 13.3  Hematocrit 36.0 - 46.0 % 41.7 41.2 41  Platelets 150 - 400 K/uL 349 347 313   ASSESSMENT & PLAN:   Assessment/Plan:  A 55 y.o. female with  iron deficiency anemia.  Her labs today clearly show her hemoglobin remains fine  after receiving IV Feraheme months ago.  Understandably, the patient was pleased with her lab results today.  As her menstrual cycles remain very heavy, I have told the patient to consider taking oral iron during those days when  her menstrual cycle is on.  Otherwise, as she is stable from a hematologic standpoint, I will see her back in 6 months for repeat clinical assessment.  .The patient understands all the plans discussed today and is in agreement with them.    Rufino Staup Macarthur Critchley, MD

## 2020-09-18 ENCOUNTER — Inpatient Hospital Stay: Payer: Medicare Other | Attending: Oncology | Admitting: Oncology

## 2020-09-18 ENCOUNTER — Other Ambulatory Visit: Payer: Self-pay | Admitting: Oncology

## 2020-09-18 ENCOUNTER — Other Ambulatory Visit: Payer: Self-pay

## 2020-09-18 ENCOUNTER — Inpatient Hospital Stay: Payer: Medicare Other

## 2020-09-18 DIAGNOSIS — D500 Iron deficiency anemia secondary to blood loss (chronic): Secondary | ICD-10-CM

## 2020-09-18 DIAGNOSIS — R0781 Pleurodynia: Secondary | ICD-10-CM

## 2020-09-19 ENCOUNTER — Encounter (HOSPITAL_COMMUNITY): Payer: Self-pay

## 2020-09-19 ENCOUNTER — Emergency Department (HOSPITAL_COMMUNITY)
Admission: EM | Admit: 2020-09-19 | Discharge: 2020-09-19 | Disposition: A | Discharge status: Home / Self Care | Payer: Medicare Other | Attending: Emergency Medicine | Admitting: Emergency Medicine

## 2020-09-19 DIAGNOSIS — Z79899 Other long term (current) drug therapy: Secondary | ICD-10-CM | POA: Diagnosis not present

## 2020-09-19 DIAGNOSIS — M546 Pain in thoracic spine: Secondary | ICD-10-CM | POA: Diagnosis present

## 2020-09-19 DIAGNOSIS — I10 Essential (primary) hypertension: Secondary | ICD-10-CM | POA: Diagnosis not present

## 2020-09-19 DIAGNOSIS — Z87891 Personal history of nicotine dependence: Secondary | ICD-10-CM | POA: Insufficient documentation

## 2020-09-19 DIAGNOSIS — Z7982 Long term (current) use of aspirin: Secondary | ICD-10-CM | POA: Insufficient documentation

## 2020-09-19 DIAGNOSIS — M549 Dorsalgia, unspecified: Secondary | ICD-10-CM

## 2020-09-19 DIAGNOSIS — R0602 Shortness of breath: Secondary | ICD-10-CM | POA: Insufficient documentation

## 2020-09-19 DIAGNOSIS — I2510 Atherosclerotic heart disease of native coronary artery without angina pectoris: Secondary | ICD-10-CM | POA: Insufficient documentation

## 2020-09-19 LAB — CBC WITH DIFFERENTIAL/PLATELET
Abs Immature Granulocytes: 0.06 10*3/uL (ref 0.00–0.07)
Basophils Absolute: 0 10*3/uL (ref 0.0–0.1)
Basophils Relative: 0 %
Eosinophils Absolute: 0 10*3/uL (ref 0.0–0.5)
Eosinophils Relative: 0 %
HCT: 41.7 % (ref 36.0–46.0)
Hemoglobin: 13.1 g/dL (ref 12.0–15.0)
Immature Granulocytes: 1 %
Lymphocytes Relative: 4 %
Lymphs Abs: 0.4 10*3/uL — ABNORMAL LOW (ref 0.7–4.0)
MCH: 28.2 pg (ref 26.0–34.0)
MCHC: 31.4 g/dL (ref 30.0–36.0)
MCV: 89.9 fL (ref 80.0–100.0)
Monocytes Absolute: 0 10*3/uL — ABNORMAL LOW (ref 0.1–1.0)
Monocytes Relative: 0 %
Neutro Abs: 9.1 10*3/uL — ABNORMAL HIGH (ref 1.7–7.7)
Neutrophils Relative %: 95 %
Platelets: 349 10*3/uL (ref 150–400)
RBC: 4.64 MIL/uL (ref 3.87–5.11)
RDW: 13.2 % (ref 11.5–15.5)
WBC: 9.6 10*3/uL (ref 4.0–10.5)
nRBC: 0 % (ref 0.0–0.2)

## 2020-09-19 LAB — BASIC METABOLIC PANEL
Anion gap: 8 (ref 5–15)
BUN: 10 mg/dL (ref 6–20)
CO2: 27 mmol/L (ref 22–32)
Calcium: 9.7 mg/dL (ref 8.9–10.3)
Chloride: 104 mmol/L (ref 98–111)
Creatinine, Ser: 0.75 mg/dL (ref 0.44–1.00)
GFR, Estimated: >60 mL/min (ref >60)
Glucose, Bld: 173 mg/dL — ABNORMAL HIGH (ref 70–99)
Potassium: 4.5 mmol/L (ref 3.5–5.1)
Sodium: 139 mmol/L (ref 135–145)

## 2020-09-19 LAB — D-DIMER, QUANTITATIVE: D-Dimer, Quant: 0.35 ug/mL-FEU (ref 0.00–0.50)

## 2020-09-19 MED ORDER — MORPHINE SULFATE (PF) 4 MG/ML IV SOLN
4.0000 mg | Freq: Once | INTRAVENOUS | Status: AC
  Administered 2020-09-19: 4 mg via INTRAVENOUS
  Filled 2020-09-19: qty 1

## 2020-09-19 MED ORDER — HYDROCODONE-ACETAMINOPHEN 7.5-325 MG PO TABS: 1.0000 | ORAL_TABLET | Freq: Four times a day (QID) | ORAL | 0 refills | Status: AC | PRN

## 2020-09-19 MED ORDER — CYCLOBENZAPRINE HCL 5 MG PO TABS: 5.0000 mg | ORAL_TABLET | Freq: Three times a day (TID) | ORAL | 0 refills | Status: AC | PRN

## 2020-09-19 MED ORDER — HYDROCODONE-ACETAMINOPHEN 7.5-325 MG PO TABS
1.0000 | ORAL_TABLET | Freq: Once | ORAL | Status: AC
  Administered 2020-09-19: 1 via ORAL
  Filled 2020-09-19: qty 1

## 2020-09-19 NOTE — ED Triage Notes (Signed)
Pr arrived POV complaining of back pain that started 2 days ago. Was seen at [facility] and by primary for pain with no findings  Pain is 10/10 stabbing pain  Denies trauma and no obvious deformities  3L [place] at baseline

## 2020-09-19 NOTE — ED Provider Notes (Signed)
[facility] EMERGENCY DEPARTMENT Provider Note   CSN: [id number] Arrival date & time: 09/19/20  4098     History Chief Complaint  Patient presents with  . Back Pain    Carol Cummings is a 55 y.o. female.  HPI   55 year old female past medical history of COPD on chronic O2, HTN, obesity presents the emergency department with left mid/lateral back pain.  Patient states that this started 3 days ago.  She describes it as a sharp 10/10 sensation.  The pain does not radiate anywhere into her chest or abdomen.  She states that she was seen by her primary doctor and the ER, she believes a chest x-ray was taken, she was given Valium and Lidoderm patches and pain medicine without any significant relief.  She presents today because the pain is worse and now causing shortness of breath.  Patient denies any injury to the area.  No overlying rash.  No fever.  She has had no productive cough or GI symptoms.  No swelling of her lower extremities.  No recent acute illness.  Past Medical History:  Diagnosis Date  . Emphysema lung ([place])   . Hypertension   . Oxygen dependent    2-6 L    Patient Active Problem List   Diagnosis Date Noted  . Obesity (BMI 30-39.9) 09/06/2020  . Oxygen dependent   . Coronary artery disease involving native coronary artery of native heart 09/08/2019  . Essential hypertension 09/08/2019  . Shortness of breath 07/26/2019  . Bilateral leg edema 07/26/2019  . Sinus bradycardia 07/26/2019  . Hyperlipidemia 08/31/2017  . O2 dependent 08/31/2017  . Hypertension 08/31/2017  . Emphysema lung ([place]) 08/31/2017  . Palpitations 08/31/2017  . Sinus tachycardia 08/31/2017  . Abnormal PFT 04/26/2013  . Chronic respiratory failure ([place]) 11/28/2012  . Anxiety 11/28/2012  . Chest tightness or pressure 11/28/2012  . Chronic constipation 11/28/2012  . Family history of early CAD 11/28/2012  . Overweight 11/28/2012  . Sore throat 11/28/2012  . Obesity,  unspecified 07/12/2007  . Chronic obstructive pulmonary disease, unspecified ([place]) 07/12/2007    Past Surgical History:  Procedure Laterality Date  . BLADDER SUSPENSION    . large intestine surgery    . LUNG LOBECTOMY     both upper lobes  . NASAL SEPTUM SURGERY    . TUBAL LIGATION       OB History   No obstetric history on file.     Family History  Problem Relation Age of Onset  . Stomach cancer Mother   . Hyperlipidemia Mother   . Hypertension Mother   . Diabetes Mother   . Stroke Mother   . Emphysema Father   . Heart attack Father   . Aneurysm Sister   . Emphysema Brother     Social History   Tobacco Use  . Smoking status: Former Research scientist (life sciences)  . Smokeless tobacco: Never Used  Vaping Use  . Vaping Use: Never used  Substance Use Topics  . Alcohol use: Not Currently  . Drug use: Never    Home Medications Prior to Admission medications   Medication Sig Start Date End Date Taking? Authorizing Provider  acetaminophen (TYLENOL) 500 MG tablet Take 500 mg by mouth every 6 (six) hours as needed for mild pain or moderate pain.    [provider]  albuterol (PROVENTIL) (2.5 MG/3ML) 0.083% nebulizer solution Take 2.5 mg by nebulization 4 (four) times daily. 07/18/19   [provider]  albuterol (VENTOLIN HFA) 108 (  90 Base) MCG/ACT inhaler Inhale into the lungs 3 (three) times daily as needed for wheezing or shortness of breath. 03/01/13   [provider]  ALPRAZolam Duanne Moron) 1 MG tablet 1 mg 2 (two) times daily. 07/13/17   [provider]  aspirin EC 81 MG tablet Take 1 tablet (81 mg total) by mouth daily. 08/25/19   Tobb, Kardie, DO  azelastine (ASTELIN) 0.1 % nasal spray Place 1 spray into both nostrils 2 (two) times daily. 09/05/13   [provider]  azithromycin (ZITHROMAX) 250 MG tablet Take 250 mg by mouth 3 (three) times a week. 07/23/19   [provider]  budesonide (PULMICORT) 0.5 MG/2ML nebulizer solution Take 0.5 mg by  nebulization 2 (two) times daily. 12/18/19   [provider]  buPROPion (WELLBUTRIN SR) 150 MG 12 hr tablet Take 150 mg by mouth 2 (two) times daily. 04/22/15   [provider]  dexamethasone (DECADRON) 6 MG tablet Take 6 mg by mouth 2 (two) times daily. 02/21/20   [provider]  Fluticasone-Umeclidin-Vilant (TRELEGY ELLIPTA) 200-62.5-25 MCG/INH AEPB Inhale 25 mcg into the lungs daily. 10/30/19   [provider]  furosemide (LASIX) 20 MG tablet Take one tablet on Tuesdays and Saturdays 06/20/20   Tobb, Kardie, DO  guaiFENesin (MUCINEX) 600 MG 12 hr tablet Take 600 mg by mouth as needed for cough or to loosen phlegm. For cough    [provider]  ipratropium-albuterol (DUONEB) 0.5-2.5 (3) MG/3ML SOLN Inhale 3 mLs into the lungs 4 (four) times daily. 07/07/10   [provider]  levofloxacin (LEVAQUIN) 500 MG tablet Take 500 mg by mouth daily. 02/14/20   [provider]  metoprolol tartrate (LOPRESSOR) 25 MG tablet Take 12.5 mg by mouth 2 (two) times daily.  05/24/15   [provider]  pantoprazole (PROTONIX) 40 MG tablet Take by mouth daily. 12/21/14   [provider]  potassium chloride SA (KLOR-CON M20) 20 MEQ tablet Take one tablet on Tuesdays and Saturdays. 10/06/19   Tobb, Kardie, DO  rosuvastatin (CRESTOR) 10 MG tablet Take 10 mg by mouth at bedtime. 03/13/20   [provider]  sulfamethoxazole-trimethoprim (BACTRIM DS) 800-160 MG tablet Take 1 tablet by mouth 2 (two) times daily. 06/25/20   [provider]    Allergies    Topiramate and Phentermine  Review of Systems   Review of Systems  Constitutional: Negative for chills and fever.  HENT: Negative for congestion.   Eyes: Negative for visual disturbance.  Respiratory: Negative for shortness of breath.   Cardiovascular: Negative for chest pain.  Gastrointestinal: Negative for abdominal pain, diarrhea and vomiting.  Genitourinary: Negative for  difficulty urinating, dysuria, flank pain, frequency and hematuria.  Musculoskeletal: Positive for back pain. Negative for neck pain.  Skin: Negative for rash.  Neurological: Negative for headaches.    Physical Exam Updated Vital Signs BP 119/78 (BP Location: Left Arm)   Pulse 93   Temp 98.8 F (37.1 C) (Oral) Comment: Simultaneous filing. User may not have seen previous data. Comment (Src): Simultaneous filing. User may not have seen previous data.  Resp (!) 24   Ht 5\' 5"  (1.651 m)   Wt 88.9 kg   SpO2 100%   BMI 32.62 kg/m   Physical Exam Vitals and nursing note reviewed.  Constitutional:      Appearance: Normal appearance.  HENT:     Head: Normocephalic.     Mouth/Throat:     Mouth: Mucous membranes are moist.  Cardiovascular:  Rate and Rhythm: Normal rate.  Pulmonary:     Effort: Pulmonary effort is normal. No respiratory distress.  Abdominal:     Palpations: Abdomen is soft.     Tenderness: There is no abdominal tenderness.  Musculoskeletal:     Comments: Diffuse tenderness to palpation of the left lateral lower thoracic/upper lumbar and flank area, no midline spinal tenderness, no overlying rash or skin changes  Skin:    General: Skin is warm.  Neurological:     Mental Status: She is alert and oriented to person, place, and time. Mental status is at baseline.  Psychiatric:        Mood and Affect: Mood normal.     ED Results / Procedures / Treatments   Labs (all labs ordered are listed, but only abnormal results are displayed) Labs Reviewed  CBC WITH DIFFERENTIAL/PLATELET  BASIC METABOLIC PANEL  D-DIMER, QUANTITATIVE    EKG None  Radiology No results found.  Procedures Procedures   Medications Ordered in ED Medications  morphine 4 MG/ML injection 4 mg (4 mg Intravenous Given 09/19/20 0850)    ED Course  I have reviewed the triage vital signs and the nursing notes.  Pertinent labs & imaging results that were available during my care of the  patient were reviewed by me and considered in my medical decision making (see chart for details).    MDM Rules/Calculators/A&P                          55 year old female presents emergency department with left sided back pain.  This has been unrelieved with muscle relaxers, pain patches and pain medicine.  She is tachypneic at baseline on supplemental oxygen, normal oxygenation.  She has diffuse tenderness to palpation of the left lateral back area and flank, no midline tenderness, no abdominal tenderness.  Patient has significant relief with morphine.  Blood work is normal, specifically the D-dimer is normal.  This rules out my low suspicion for PE/dissection.  This discomfort is very musculoskeletal and reproducible.  No specific flank pain or GU symptoms to suggest kidney stone, abdomen is soft and benign.  We will treat more aggressively for musculoskeletal and referred to orthopedics for further care.  Discussed strict return to ED instructions, vitals are stable, she is ambulatory.  Had a long discussion with the patient in regards to not mixing muscle relaxing medication and pain medicine, also avoiding alcohol and other restrictions.  Patient will be discharged and treated as an outpatient.  Discharge plan and strict return to ED precautions discussed, patient verbalizes understanding and agreement.  Final Clinical Impression(s) / ED Diagnoses Final diagnoses:  None    Rx / DC Orders ED Discharge Orders    None       Lorelle Gibbs, DO 09/19/20 1106

## 2020-09-19 NOTE — Discharge Instructions (Addendum)
You have been seen and discharged from the emergency department.  You may continue to use your Lidoderm patches.  Discontinue the Valium and try Flexeril.  You have also been prescribed pain medicine.  Do not mix this medication with alcohol or other sedating medications. Do not drive or do heavy physical activity and to know how this medication affects you.  It may cause drowsiness.  Do not take the muscle relaxer and pain medicine at the same time.  Of note there is Tylenol within the pain medicine so do not take any additional Tylenol.  Establish care with orthopedics for further care of back pain.  Follow-up with your primary provider for reevaluation and further care. Take home medications as prescribed. If you have any worsening symptoms, chest pain, difficulty breathing, painful urination/blood in the urine or further concerns for health please return to an emergency department for further evaluation.

## 2020-09-25 DIAGNOSIS — D500 Iron deficiency anemia secondary to blood loss (chronic): Secondary | ICD-10-CM | POA: Insufficient documentation

## 2020-09-25 HISTORY — DX: Iron deficiency anemia secondary to blood loss (chronic): D50.0

## 2020-10-07 ENCOUNTER — Other Ambulatory Visit: Payer: Self-pay | Admitting: Cardiology

## 2020-10-07 ENCOUNTER — Encounter: Payer: Self-pay | Admitting: Oncology

## 2020-10-09 ENCOUNTER — Encounter: Payer: Self-pay | Admitting: Oncology

## 2020-10-14 ENCOUNTER — Other Ambulatory Visit: Payer: Self-pay

## 2020-10-14 MED ORDER — FUROSEMIDE 20 MG PO TABS: ORAL_TABLET | ORAL | 3 refills | Status: DC

## 2020-10-14 NOTE — Telephone Encounter (Signed)
Refill sent to pharmacy.   

## 2020-12-06 MED ORDER — FUROSEMIDE 20 MG PO TABS: 20.0000 mg | ORAL_TABLET | Freq: Every day | ORAL | 3 refills | Status: DC

## 2021-03-20 ENCOUNTER — Ambulatory Visit: Payer: Medicare Other | Admitting: Oncology

## 2021-03-20 ENCOUNTER — Other Ambulatory Visit: Payer: Self-pay | Admitting: Oncology

## 2021-03-20 ENCOUNTER — Other Ambulatory Visit: Payer: Medicare Other

## 2021-03-20 DIAGNOSIS — D500 Iron deficiency anemia secondary to blood loss (chronic): Secondary | ICD-10-CM

## 2021-05-13 ENCOUNTER — Encounter: Payer: Self-pay | Admitting: Oncology

## 2021-05-21 ENCOUNTER — Telehealth: Payer: Self-pay | Admitting: Oncology

## 2021-05-21 NOTE — Telephone Encounter (Signed)
Patient rescheduled Missed Oct Appt's to 12/22 Labs, Follow Up

## 2021-05-28 NOTE — Progress Notes (Signed)
[facility]  [address] [place],  [place]  [address] [phone]  Clinic Day:  05/29/2021  Referring physician: [facility]*  This document serves as a record of services personally performed by Marice Potter, MD. It was created on their behalf by Curry,Lauren E, a trained medical scribe. The creation of this record is based on the scribe's personal observations and the provider's statements to them.  HISTORY OF PRESENT ILLNESS:  The patient is a 55 y.o. female with iron deficiency anemia, likely related to her heavy menstrual cycles.  She comes in today to reassess her iron and hemoglobin levels.  In these recent past, IV Feraheme was very effective in replenishing her iron and normalizing her hemoglobin.  Overall, the patient claims to be feeling well.  Outside of her menstrual cycles, she denies having other overt forms of blood loss since her last visit.  PHYSICAL EXAM:  Blood pressure 128/78, pulse 92, temperature 97.8 F (36.6 C), resp. rate 18, height 5\' 5"  (1.651 m), weight 192 lb 4.8 oz (87.2 kg), SpO2 98 %. Wt Readings from Last 3 Encounters:  05/29/21 192 lb 4.8 oz (87.2 kg)  09/19/20 196 lb (88.9 kg)  09/18/20 196 lb 14.4 oz (89.3 kg)   Body mass index is 32 kg/m. Performance status (ECOG): 0 Physical Exam Constitutional:      Appearance: Normal appearance. She is not ill-appearing.  HENT:     Mouth/Throat:     Mouth: Mucous membranes are moist.     Pharynx: Oropharynx is clear. No oropharyngeal exudate or posterior oropharyngeal erythema.  Cardiovascular:     Rate and Rhythm: Normal rate and regular rhythm.     Heart sounds: No murmur heard.   No friction rub. No gallop.  Pulmonary:     Effort: Pulmonary effort is normal. No respiratory distress.     Breath sounds: Normal breath sounds. No wheezing, rhonchi or rales.  Abdominal:     General: Bowel sounds are normal. There is no distension.     Palpations:  Abdomen is soft. There is no mass.     Tenderness: There is no abdominal tenderness.  Musculoskeletal:        General: No swelling.     Right lower leg: No edema.     Left lower leg: No edema.  Lymphadenopathy:     Cervical: No cervical adenopathy.     Upper Body:     Right upper body: No supraclavicular or axillary adenopathy.     Left upper body: No supraclavicular or axillary adenopathy.     Lower Body: No right inguinal adenopathy. No left inguinal adenopathy.  Skin:    General: Skin is warm.     Coloration: Skin is not jaundiced.     Findings: No lesion or rash.  Neurological:     General: No focal deficit present.     Mental Status: She is alert and oriented to person, place, and time. Mental status is at baseline.  Psychiatric:        Mood and Affect: Mood normal.        Behavior: Behavior normal.        Thought Content: Thought content normal.   LABS:    Latest Reference Range & Units 05/29/21 00:00  WBC  7.2  RBC 3.87 - 5.11  4.48  Hemoglobin 12.0 - 16.0  12.2  HCT 36 - 46  38  MCV  84  Platelets 150 - 399  314  NEUT#  4.82    Latest Reference Range & Units 05/29/21 13:21 05/29/21 13:23  Iron 28 - 170 ug/dL 47   UIBC ug/dL 333   TIBC 250 - 450 ug/dL 380   Saturation Ratios 10.4 - 31.8 % 12   Ferritin 11 - 307 ng/mL  22   CBC Latest Ref Rng & Units 05/29/2021 09/19/2020 09/06/2020  WBC - 7.2 9.6 11.5(H)  Hemoglobin 12.0 - 16.0 12.2 13.1 13.0  Hematocrit 36 - 46 38 41.7 41.2  Platelets 150 - 399 314 349 347   ASSESSMENT & PLAN:  Assessment/Plan:  A 55 y.o. female with iron deficiency anemia.  Her labs today show that her hemoglobin remains within the normal range.  Although lower than previously, her iron parameters  remain normal.  Clinically, the patient is doing well.  As that is the case, I will see her back in 4 months for repeat clinical assessment.  The patient understands all the plans discussed today and is in agreement with them.    I, Rita Ohara, am  acting as scribe for Marice Potter, MD    I have reviewed this report as typed by the medical scribe, and it is complete and accurate.  Dequincy Macarthur Critchley, MD

## 2021-05-29 ENCOUNTER — Encounter: Payer: Self-pay | Admitting: Oncology

## 2021-05-29 ENCOUNTER — Inpatient Hospital Stay: Payer: Medicare Other | Attending: Oncology

## 2021-05-29 ENCOUNTER — Inpatient Hospital Stay (INDEPENDENT_AMBULATORY_CARE_PROVIDER_SITE_OTHER): Payer: Medicare Other | Admitting: Oncology

## 2021-05-29 VITALS — BP 128/78 | HR 92 | Temp 97.8°F | Resp 18 | Ht 65.0 in | Wt 192.3 lb

## 2021-05-29 DIAGNOSIS — D500 Iron deficiency anemia secondary to blood loss (chronic): Secondary | ICD-10-CM

## 2021-05-29 DIAGNOSIS — D509 Iron deficiency anemia, unspecified: Secondary | ICD-10-CM | POA: Insufficient documentation

## 2021-05-29 LAB — CBC AND DIFFERENTIAL
HCT: 38 (ref 36–46)
Hemoglobin: 12.2 (ref 12.0–16.0)
Neutrophils Absolute: 4.82
Platelets: 314 (ref 150–399)
WBC: 7.2

## 2021-05-29 LAB — IRON AND TIBC
Iron: 47 ug/dL (ref 28–170)
Saturation Ratios: 12 % (ref 10.4–31.8)
TIBC: 380 ug/dL (ref 250–450)
UIBC: 333 ug/dL

## 2021-05-29 LAB — CBC: RBC: 4.48 (ref 3.87–5.11)

## 2021-05-29 LAB — FERRITIN: Ferritin: 22 ng/mL (ref 11–307)

## 2021-07-13 DIAGNOSIS — I361 Nonrheumatic tricuspid (valve) insufficiency: Secondary | ICD-10-CM | POA: Diagnosis not present

## 2021-07-30 ENCOUNTER — Other Ambulatory Visit: Payer: Self-pay

## 2021-07-30 DIAGNOSIS — R9431 Abnormal electrocardiogram [ECG] [EKG]: Secondary | ICD-10-CM | POA: Insufficient documentation

## 2021-07-30 DIAGNOSIS — I4891 Unspecified atrial fibrillation: Secondary | ICD-10-CM | POA: Insufficient documentation

## 2021-07-30 DIAGNOSIS — G4733 Obstructive sleep apnea (adult) (pediatric): Secondary | ICD-10-CM | POA: Insufficient documentation

## 2021-07-30 DIAGNOSIS — F419 Anxiety disorder, unspecified: Secondary | ICD-10-CM

## 2021-07-30 DIAGNOSIS — J180 Bronchopneumonia, unspecified organism: Secondary | ICD-10-CM | POA: Insufficient documentation

## 2021-07-30 DIAGNOSIS — F32A Depression, unspecified: Secondary | ICD-10-CM | POA: Insufficient documentation

## 2021-07-30 HISTORY — DX: Depression, unspecified: F32.A

## 2021-07-30 HISTORY — DX: Abnormal electrocardiogram (ECG) (EKG): R94.31

## 2021-07-30 HISTORY — DX: Bronchopneumonia, unspecified organism: J18.0

## 2021-07-30 HISTORY — DX: Obstructive sleep apnea (adult) (pediatric): G47.33

## 2021-07-30 HISTORY — DX: Unspecified atrial fibrillation: I48.91

## 2021-07-30 HISTORY — DX: Anxiety disorder, unspecified: F41.9

## 2021-08-04 ENCOUNTER — Ambulatory Visit: Payer: Medicare Other | Admitting: Cardiology

## 2021-08-08 ENCOUNTER — Other Ambulatory Visit: Payer: Self-pay

## 2021-08-11 ENCOUNTER — Ambulatory Visit (INDEPENDENT_AMBULATORY_CARE_PROVIDER_SITE_OTHER): Payer: Medicare Other | Admitting: Cardiology

## 2021-08-11 ENCOUNTER — Encounter: Payer: Self-pay | Admitting: Cardiology

## 2021-08-11 ENCOUNTER — Other Ambulatory Visit: Payer: Self-pay

## 2021-08-11 VITALS — BP 126/66 | HR 90 | Ht 65.0 in | Wt 188.8 lb

## 2021-08-11 DIAGNOSIS — I2510 Atherosclerotic heart disease of native coronary artery without angina pectoris: Secondary | ICD-10-CM

## 2021-08-11 DIAGNOSIS — J431 Panlobular emphysema: Secondary | ICD-10-CM

## 2021-08-11 DIAGNOSIS — R001 Bradycardia, unspecified: Secondary | ICD-10-CM | POA: Diagnosis not present

## 2021-08-11 NOTE — Patient Instructions (Addendum)

## 2021-08-11 NOTE — Progress Notes (Signed)
Cardiology Office Note:    Date:  08/11/2021   ID:  Carol Cummings, DOB 10-Jul-1965, MRN [id number]  PCP:  Darrol Jump, NP  Cardiologist:  Jenean Lindau, MD   Referring MD: [facility]*    ASSESSMENT:    1. Sinus bradycardia   2. Coronary artery disease involving native coronary artery of native heart without angina pectoris   3. Panlobular emphysema (HCC)    PLAN:    In order of problems listed above:  Coronary artery calcification: Secondary prevention stressed with the patient.  Importance of compliance with diet medication stressed and she vocalized understanding.  She mentions to me that with activities of daily living she has no issues of chest pain or orthopnea or any such issues.  She has shortness of breath on exertion which is stable and she is on oxygen for this.  At the time of my evaluation, the patient is alert awake oriented and in no distress. Paroxysmal atrial fibrillation: There are multiple and complex issues in this lady's care.  She has had 1 documented episode of atrial fibrillation when she was having an upper respiratory tract infection and was in the hospital.  She denies any other symptoms of palpitations or such issues.  Her CHA2DS2-VASc score is 2.  Given the fact that she has hypertension and coronary artery calcification.  I mentioned to her about anticoagulation and she is not keen on it.  She is scared of anticoagulation and its attendant risks.  I respect her wishes.  The patient also mentions to me that she has iron deficiency anemia and that she needs iron transfusions on a very regular basis.  I also noticed that her TSH is significantly low and she might have hyperthyroidism and told her to get in touch with primary care for this issue.  Again benefits and potential risks of anticoagulation explained and she vocalized understanding and she is vehemently against it.  I respect her wishes. Essential hypertension: Blood pressure stable and  diet was emphasized.  Lifestyle modification urged. Mixed dyslipidemia: Lipids managed by primary care.  She is on statin therapy.  Weight reduction stressed and she promises to do better.  Reports from [place] and echocardiogram was reviewed with her at length at length and questions were answered to her satisfaction.   Medication Adjustments/Labs and Tests Ordered: Current medicines are reviewed at length with the patient today.  Concerns regarding medicines are outlined above.  Orders Placed This Encounter  Procedures   EKG 12-Lead   No orders of the defined types were placed in this encounter.    No chief complaint on file.    History of Present Illness:    Carol Cummings is a 56 y.o. female.  Patient has past medical history of coronary artery calcification, essential hypertension and dyslipidemia.  She has COPD on oxygen.  She mentions to me that she leads a sedentary lifestyle.  She also has issues with iron deficiency and takes iron transfusions.  No chest pain orthopnea or PND.  She is here for follow-up because her hospitalization revealed an episode of atrial fibrillation.  At the time of my evaluation, the patient is alert awake oriented and in no distress.  Past Medical History:  Diagnosis Date   Abnormal PFT 04/26/2013   Anxiety 11/28/2012   Anxiety and depression 07/30/2021   Atrial fibrillation with RVR ([place]) 07/30/2021   Bilateral leg edema 07/26/2019   Bronchopneumonia 07/30/2021   Chest tightness or pressure 11/28/2012  Chronic constipation 11/28/2012   Chronic obstructive pulmonary disease, unspecified ([place]) 07/12/2007   Qualifier: Diagnosis of  By: Melvyn Novas MD, Anselm Jungling of this note might be different from the original. Overview:  Qualifier: Diagnosis of  By: Melvyn Novas MD, Legrand Como B   Chronic respiratory failure ([place]) 11/28/2012   Coronary artery disease involving native coronary artery of native heart 09/08/2019   EKG abnormalities 07/30/2021    Emphysema lung ([place])    Essential hypertension 09/08/2019   Family history of early CAD 11/28/2012   Hyperlipidemia 08/31/2017   Hypertension    Iron deficiency anemia due to chronic blood loss 09/25/2020   O2 dependent 08/31/2017   2-6 L   Obesity (BMI 30-39.9) 09/06/2020   Obesity, unspecified 07/12/2007   Qualifier: Diagnosis of  By: Melvyn Novas MD, Anselm Jungling of this note might be different from the original. Overview:  Qualifier: Diagnosis of  By: Melvyn Novas MD, Legrand Como B   OSA (obstructive sleep apnea) 07/30/2021   Overweight 11/28/2012   Oxygen dependent    2-6 L   Palpitations 08/31/2017   Shortness of breath 07/26/2019   Sinus bradycardia 07/26/2019   Sinus tachycardia 08/31/2017   Sore throat 11/28/2012    Past Surgical History:  Procedure Laterality Date   BLADDER SUSPENSION     large intestine surgery     LUNG LOBECTOMY     both upper lobes   NASAL SEPTUM SURGERY     TUBAL LIGATION      Current Medications: Current Meds  Medication Sig   acetaminophen (TYLENOL) 500 MG tablet Take 500 mg by mouth every 6 (six) hours as needed for mild pain or moderate pain.   albuterol (PROVENTIL) (2.5 MG/3ML) 0.083% nebulizer solution Take 2.5 mg by nebulization 4 (four) times daily.   albuterol (VENTOLIN HFA) 108 (90 Base) MCG/ACT inhaler Inhale 1 puff into the lungs 3 (three) times daily as needed for wheezing or shortness of breath.   ALPRAZolam (XANAX) 1 MG tablet Take 1 mg by mouth 2 (two) times daily.   aspirin EC 81 MG tablet Take 1 tablet (81 mg total) by mouth daily.   azelastine (ASTELIN) 0.1 % nasal spray Place 1 spray into both nostrils 2 (two) times daily.   budesonide (PULMICORT) 0.5 MG/2ML nebulizer solution Take 0.5 mg by nebulization 2 (two) times daily.   buPROPion (WELLBUTRIN SR) 150 MG 12 hr tablet Take 150 mg by mouth 2 (two) times daily.   Fluticasone-Umeclidin-Vilant (TRELEGY ELLIPTA) 200-62.5-25 MCG/INH AEPB Inhale 25 mcg into the lungs daily.   furosemide (LASIX) 20 MG  tablet Take 1 tablet (20 mg total) by mouth daily.   guaiFENesin (MUCINEX) 600 MG 12 hr tablet Take 600 mg by mouth as needed for cough or to loosen phlegm. For cough   ipratropium-albuterol (DUONEB) 0.5-2.5 (3) MG/3ML SOLN Inhale 3 mLs into the lungs 4 (four) times daily.   loratadine (CLARITIN) 10 MG tablet Take 10 mg by mouth daily.   metoprolol tartrate (LOPRESSOR) 25 MG tablet Take 12.5 mg by mouth 2 (two) times daily.    pantoprazole (PROTONIX) 40 MG tablet Take 40 mg by mouth daily.   rosuvastatin (CRESTOR) 20 MG tablet Take 20 mg by mouth at bedtime.   sulfamethoxazole-trimethoprim (BACTRIM DS) 800-160 MG tablet Take 1 tablet by mouth 2 (two) times daily.     Allergies:   Topiramate and Phentermine   Social History   Socioeconomic History   Marital status: Married    Spouse name: Not  on file   Number of children: Not on file   Years of education: Not on file   Highest education level: Not on file  Occupational History   Not on file  Tobacco Use   Smoking status: Former   Smokeless tobacco: Never  Vaping Use   Vaping Use: Never used  Substance and Sexual Activity   Alcohol use: Not Currently   Drug use: Never   Sexual activity: Not on file  Other Topics Concern   Not on file  Social History Narrative   Not on file   Social Determinants of Health   Financial Resource Strain: Not on file  Food Insecurity: Not on file  Transportation Needs: Not on file  Physical Activity: Not on file  Stress: Not on file  Social Connections: Not on file     Family History: The patient's family history includes Aneurysm in her sister; Diabetes in her mother; Emphysema in her brother and father; Heart attack in her father; Hyperlipidemia in her mother; Hypertension in her mother; Stomach cancer in her mother; Stroke in her mother.  ROS:   Please see the history of present illness.    All other systems reviewed and are negative.  EKGs/Labs/Other Studies Reviewed:    The  following studies were reviewed today: EKG reveals sinus rhythm with nonspecific ST-T changes   Recent Labs: 09/06/2020: Magnesium 1.8 09/19/2020: BUN 10; Creatinine, Ser 0.75; Potassium 4.5; Sodium 139 05/29/2021: Hemoglobin 12.2; Platelets 314  Recent Lipid Panel    Component Value Date/Time   CHOL 170 04/25/2019 1013   TRIG 142 04/25/2019 1013   HDL 54 04/25/2019 1013   CHOLHDL 3.1 04/25/2019 1013   LDLCALC 91 04/25/2019 1013    Physical Exam:    VS:  BP 126/66    Pulse 90    Ht '5\' 5"'$  (1.651 m)    Wt 188 lb 12.8 oz (85.6 kg)    SpO2 99%    BMI 31.42 kg/m     Wt Readings from Last 3 Encounters:  08/11/21 188 lb 12.8 oz (85.6 kg)  05/29/21 192 lb 4.8 oz (87.2 kg)  09/19/20 196 lb (88.9 kg)     GEN: Patient is in no acute distress HEENT: Normal NECK: No JVD; No carotid bruits LYMPHATICS: No lymphadenopathy CARDIAC: Hear sounds regular, 2/6 systolic murmur at the apex. RESPIRATORY:  Clear to auscultation without rales, wheezing or rhonchi  ABDOMEN: Soft, non-tender, non-distended MUSCULOSKELETAL:  No edema; No deformity  SKIN: Warm and dry NEUROLOGIC:  Alert and oriented x 3 PSYCHIATRIC:  Normal affect   Signed, Jenean Lindau, MD  08/11/2021 10:22 AM    [facility]

## 2021-08-13 ENCOUNTER — Encounter: Payer: Self-pay | Admitting: Cardiology

## 2021-08-18 ENCOUNTER — Ambulatory Visit: Payer: Medicare Other | Admitting: Cardiology

## 2021-08-18 ENCOUNTER — Encounter: Payer: Self-pay | Admitting: Oncology

## 2021-08-25 ENCOUNTER — Telehealth: Payer: Self-pay | Admitting: Cardiology

## 2021-08-25 NOTE — Telephone Encounter (Signed)
Patient states at her last appt Dr. Geraldo Pitter advised he will be sending lab orders to her PCP, but they have not received anything. Please advise.  ?

## 2021-08-29 ENCOUNTER — Other Ambulatory Visit: Payer: Self-pay | Admitting: Nurse Practitioner

## 2021-09-07 NOTE — Progress Notes (Signed)
Yes, this provider. ?[facility]  ?[address] ?[place],  [place]  [address] ?[phone] ? ?Clinic Day:  09/11/2021 ? ?Referring physician: Darrol Jump, NP ? ?HISTORY OF PRESENT ILLNESS:  ?The patient is a 56 y.o. female with iron deficiency anemia, likely related to her heavy menstrual cycles.  She comes in today to reassess her iron and hemoglobin levels.  In these recent past, IV Feraheme was very effective in replenishing her iron and normalizing her hemoglobin.  Overall, the patient claims to be feeling okay.  She was recently hospitalized for a respiratory illness, from which she has recuperated.  Nevertheless she remains on oxygen chronically.  Outside of her menstrual cycles, she denies having other overt forms of blood loss since her last visit. ? ?PHYSICAL EXAM:  ?Blood pressure 139/63, pulse 94, temperature 98.2 ?F (36.8 ?C), resp. rate 18, height '5\' 5"'$  (1.651 m), weight 188 lb 9.6 oz (85.5 kg), SpO2 97 %. ?Wt Readings from Last 3 Encounters:  ?09/08/21 188 lb 9.6 oz (85.5 kg)  ?08/11/21 188 lb 12.8 oz (85.6 kg)  ?05/29/21 192 lb 4.8 oz (87.2 kg)  ? ?Body mass index is 31.38 kg/m?Marland Kitchen ?Performance status (ECOG): 0 ?Physical Exam ?Constitutional:   ?   Appearance: Normal appearance. She is not ill-appearing.  ?HENT:  ?   Mouth/Throat:  ?   Mouth: Mucous membranes are moist.  ?   Pharynx: Oropharynx is clear. No oropharyngeal exudate or posterior oropharyngeal erythema.  ?Cardiovascular:  ?   Rate and Rhythm: Normal rate and regular rhythm.  ?   Heart sounds: No murmur heard. ?  No friction rub. No gallop.  ?Pulmonary:  ?   Effort: Pulmonary effort is normal. No respiratory distress.  ?   Breath sounds: Normal breath sounds. No wheezing, rhonchi or rales.  ?Abdominal:  ?   General: Bowel sounds are normal. There is no distension.  ?   Palpations: Abdomen is soft. There is no mass.  ?   Tenderness: There is no abdominal tenderness.  ?Musculoskeletal:     ?   General: No  swelling.  ?   Right lower leg: No edema.  ?   Left lower leg: No edema.  ?Lymphadenopathy:  ?   Cervical: No cervical adenopathy.  ?   Upper Body:  ?   Right upper body: No supraclavicular or axillary adenopathy.  ?   Left upper body: No supraclavicular or axillary adenopathy.  ?   Lower Body: No right inguinal adenopathy. No left inguinal adenopathy.  ?Skin: ?   General: Skin is warm.  ?   Coloration: Skin is not jaundiced.  ?   Findings: No lesion or rash.  ?Neurological:  ?   General: No focal deficit present.  ?   Mental Status: She is alert and oriented to person, place, and time. Mental status is at baseline.  ?Psychiatric:     ?   Mood and Affect: Mood normal.     ?   Behavior: Behavior normal.     ?   Thought Content: Thought content normal.  ? ?LABS:  ? ? Latest Reference Range & Units 09/08/21 00:00  ?WBC  9.6 (E)  ?RBC 3.87 - 5.11  4.64 (E)  ?Hemoglobin 12.0 - 16.0  12.5 (E)  ?HCT 36 - 46  39 (E)  ?Platelets 150 - 400 K/uL 374 (E)  ?(E): External lab result ? ? Latest Reference Range & Units 09/08/21 13:07  ?Iron 28 - 170 ug/dL 68  ?  UIBC ug/dL 352  ?TIBC 250 - 450 ug/dL 420  ?Saturation Ratios 10.4 - 31.8 % 16  ?Ferritin 11 - 307 ng/mL 21  ? ? ?ASSESSMENT & PLAN:  ?Assessment/Plan:  A 56 y.o. female with iron deficiency anemia.  I am pleased as her hemoglobin today is better than what it was at her last visit.  Her iron parameters also remain normal.  As that is the case and she is clinically doing well, I will see her back in 6 months for repeat clinical assessment.  The patient understands all the plans discussed today and is in agreement with them.   ? ?Ulmer Degen Macarthur Critchley, MD ? ? ? ?  ?

## 2021-09-08 ENCOUNTER — Inpatient Hospital Stay: Payer: Medicare Other | Attending: Oncology | Admitting: Oncology

## 2021-09-08 ENCOUNTER — Inpatient Hospital Stay: Payer: Medicare Other

## 2021-09-08 VITALS — BP 139/63 | HR 94 | Temp 98.2°F | Resp 18 | Ht 65.0 in | Wt 188.6 lb

## 2021-09-08 DIAGNOSIS — D500 Iron deficiency anemia secondary to blood loss (chronic): Secondary | ICD-10-CM

## 2021-09-08 DIAGNOSIS — D509 Iron deficiency anemia, unspecified: Secondary | ICD-10-CM | POA: Diagnosis present

## 2021-09-08 LAB — CBC: RBC: 4.64 (ref 3.87–5.11)

## 2021-09-08 LAB — CBC AND DIFFERENTIAL
HCT: 39 (ref 36–46)
Hemoglobin: 12.5 (ref 12.0–16.0)
Neutrophils Absolute: 5.57
Platelets: 374 10*3/uL (ref 150–400)
WBC: 9.6

## 2021-09-08 LAB — IRON AND TIBC
Iron: 68 ug/dL (ref 28–170)
Saturation Ratios: 16 % (ref 10.4–31.8)
TIBC: 420 ug/dL (ref 250–450)
UIBC: 352 ug/dL

## 2021-09-09 LAB — FERRITIN: Ferritin: 21 ng/mL (ref 11–307)

## 2021-09-24 ENCOUNTER — Encounter: Payer: Self-pay | Admitting: Cardiology

## 2021-09-24 ENCOUNTER — Encounter: Payer: Self-pay | Admitting: Oncology

## 2021-09-25 ENCOUNTER — Other Ambulatory Visit: Payer: Medicare Other

## 2021-09-26 ENCOUNTER — Telehealth: Payer: Medicare Other | Admitting: Oncology

## 2021-10-02 ENCOUNTER — Telehealth: Payer: Self-pay | Admitting: Cardiology

## 2021-10-02 NOTE — Telephone Encounter (Signed)
Patient requesting to switch from Dr. Geraldo Pitter to Dr. Bettina Gavia.  ?

## 2021-10-06 NOTE — Telephone Encounter (Signed)
Patient would like to switch to Dr. Agustin Cree.  ?

## 2021-10-13 ENCOUNTER — Telehealth: Payer: Self-pay

## 2021-10-13 ENCOUNTER — Encounter: Payer: Self-pay | Admitting: Cardiology

## 2021-10-13 ENCOUNTER — Ambulatory Visit (INDEPENDENT_AMBULATORY_CARE_PROVIDER_SITE_OTHER): Payer: Medicare Other | Admitting: Cardiology

## 2021-10-13 ENCOUNTER — Ambulatory Visit (INDEPENDENT_AMBULATORY_CARE_PROVIDER_SITE_OTHER): Payer: Medicare Other

## 2021-10-13 VITALS — BP 134/90 | HR 94 | Ht 65.0 in | Wt 189.6 lb

## 2021-10-13 DIAGNOSIS — J9611 Chronic respiratory failure with hypoxia: Secondary | ICD-10-CM | POA: Diagnosis not present

## 2021-10-13 DIAGNOSIS — I480 Paroxysmal atrial fibrillation: Secondary | ICD-10-CM

## 2021-10-13 DIAGNOSIS — I2510 Atherosclerotic heart disease of native coronary artery without angina pectoris: Secondary | ICD-10-CM

## 2021-10-13 DIAGNOSIS — I10 Essential (primary) hypertension: Secondary | ICD-10-CM | POA: Diagnosis not present

## 2021-10-13 NOTE — Progress Notes (Signed)
?Cardiology Office Note:   ? ?Date:  10/14/2021  ? ?ID:  Carol Cummings, DOB 1966/05/31, MRN [id number] ? ?PCP:  [facility]  ?Cardiologist:  Berniece Salines, DO  ?Electrophysiologist:  None  ? ?Referring MD: No ref. provider found  ? ?" I am doing  ? ?History of Present Illness:   ? ?Carol Cummings is a 56 y.o. 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  ? ?I saw the patient on September 07, 2018.  At that time she was still experiencing baseline shortness of breath. ? ?In transition she did see Dr. Geraldo Pitter, during that time they talked about the fact that she had developed atrial fibrillation.  She did not want to start anticoagulation.  After that visit she requested to come to [facility] to follow-up with me here.  No other complaints at this time. ?  ? ?Past Medical History:  ?Diagnosis Date  ? Abnormal PFT 04/26/2013  ? Anxiety 11/28/2012  ? Anxiety and depression 07/30/2021  ? Atrial fibrillation with RVR ([place]) 07/30/2021  ? Bilateral leg edema 07/26/2019  ? Bronchopneumonia 07/30/2021  ? Chest tightness or pressure 11/28/2012  ? Chronic constipation 11/28/2012  ? Chronic obstructive pulmonary disease, unspecified ([place]) 07/12/2007  ? Qualifier: Diagnosis of  By: Melvyn Novas MD, Anselm Jungling of this note might be different from the original. Overview:  Qualifier: Diagnosis of  By: Melvyn Novas MD, Christena Deem  ? Chronic respiratory failure ([place]) 11/28/2012  ? Coronary artery disease involving native coronary artery of native heart 09/08/2019  ? EKG abnormalities 07/30/2021  ? Emphysema lung ([place])   ? Essential hypertension 09/08/2019  ? Family history of early CAD 11/28/2012  ? Hyperlipidemia 08/31/2017   ? Hypertension   ? Iron deficiency anemia due to chronic blood loss 09/25/2020  ? O2 dependent 08/31/2017  ? 2-6 L  ? Obesity (BMI 30-39.9) 09/06/2020  ? Obesity, unspecified 07/12/2007  ? Qualifier: Diagnosis of  By: Melvyn Novas MD, Anselm Jungling of this note might be different from the original. Overview:  Qualifier: Diagnosis of  By: Melvyn Novas MD, Legrand Como B  ? OSA (obstructive sleep apnea) 07/30/2021  ? Overweight 11/28/2012  ? Oxygen dependent   ? 2-6 L  ? Palpitations 08/31/2017  ? Shortness of breath 07/26/2019  ? Sinus bradycardia 07/26/2019  ? Sinus tachycardia 08/31/2017  ? Sore throat 11/28/2012  ? ? ?Past Surgical History:  ?Procedure Laterality Date  ? BLADDER SUSPENSION    ? large intestine surgery    ? LUNG LOBECTOMY    ? both upper lobes  ? NASAL SEPTUM SURGERY    ? TUBAL LIGATION    ? ? ?Current Medications: ?Current Meds  ?Medication Sig  ? acetaminophen (TYLENOL) 500 MG tablet Take 500 mg by mouth every 6 (six) hours as needed for mild pain or moderate pain.  ? albuterol (PROVENTIL) (2.5 MG/3ML) 0.083% nebulizer solution Take 2.5 mg by nebulization 4 (four) times daily.  ? albuterol (VENTOLIN HFA) 108 (90 Base) MCG/ACT inhaler Inhale 1 puff into the lungs 3 (three) times daily as needed for wheezing or shortness of breath.  ? ALPRAZolam (XANAX) 1 MG tablet Take 1 mg by mouth 2 (two) times daily.  ? aspirin  EC 81 MG tablet Take 1 tablet (81 mg total) by mouth daily.  ? azelastine (ASTELIN) 0.1 % nasal spray Place 1 spray into both nostrils 2 (two) times daily.  ? budesonide (PULMICORT) 0.5 MG/2ML nebulizer solution Take 0.5 mg by nebulization 2 (two) times daily.  ? buPROPion (WELLBUTRIN SR) 150 MG 12 hr tablet Take 150 mg by mouth 2 (two) times daily.  ? Fluticasone-Umeclidin-Vilant (TRELEGY ELLIPTA) 200-62.5-25 MCG/INH AEPB Inhale 25 mcg into the lungs daily.  ? furosemide (LASIX) 20 MG tablet Take 1 tablet (20 mg total) by mouth daily.  ? guaiFENesin (MUCINEX) 600 MG 12 hr tablet Take 600 mg by mouth as needed  for cough or to loosen phlegm. For cough  ? HIZENTRA 10 GM/50ML SOLN Inject into the skin once a week.  ? ipratropium-albuterol (DUONEB) 0.5-2.5 (3) MG/3ML SOLN Inhale 3 mLs into the lungs 4 (four) times daily.  ? loratadine (CLARITIN) 10 MG tablet Take 10 mg by mouth daily.  ? metoprolol tartrate (LOPRESSOR) 25 MG tablet Take 12.5 mg by mouth 2 (two) times daily.   ? pantoprazole (PROTONIX) 40 MG tablet Take 40 mg by mouth daily.  ? rosuvastatin (CRESTOR) 20 MG tablet Take 20 mg by mouth at bedtime.  ?  ? ?Allergies:   Topiramate and Phentermine  ? ?Social History  ? ?Socioeconomic History  ? Marital status: Married  ?  Spouse name: Not on file  ? Number of children: Not on file  ? Years of education: Not on file  ? Highest education level: Not on file  ?Occupational History  ? Not on file  ?Tobacco Use  ? Smoking status: Former  ? Smokeless tobacco: Never  ?Vaping Use  ? Vaping Use: Never used  ?Substance and Sexual Activity  ? Alcohol use: Not Currently  ? Drug use: Never  ? Sexual activity: Not on file  ?Other Topics Concern  ? Not on file  ?Social History Narrative  ? Not on file  ? ?Social Determinants of Health  ? ?Financial Resource Strain: Not on file  ?Food Insecurity: Not on file  ?Transportation Needs: Not on file  ?Physical Activity: Not on file  ?Stress: Not on file  ?Social Connections: Not on file  ?  ? ?Family History: ?The patient's family history includes Aneurysm in her sister; Diabetes in her mother; Emphysema in her brother and father; Heart attack in her father; Hyperlipidemia in her mother; Hypertension in her mother; Stomach cancer in her mother; Stroke in her mother. ? ?ROS:   ?Review of Systems  ?Constitution: Negative for decreased appetite, fever and weight gain.  ?HENT: Negative for congestion, ear discharge, hoarse voice and sore throat.   ?Eyes: Negative for discharge, redness, vision loss in right eye and visual halos.  ?Cardiovascular: Negative for chest pain, dyspnea on exertion,  leg swelling, orthopnea and palpitations.  ?Respiratory: Negative for cough, hemoptysis, shortness of breath and snoring.   ?Endocrine: Negative for heat intolerance and polyphagia.  ?Hematologic/Lymphatic: Negative for bleeding problem. Does not bruise/bleed easily.  ?Skin: Negative for flushing, nail changes, rash and suspicious lesions.  ?Musculoskeletal: Negative for arthritis, joint pain, muscle cramps, myalgias, neck pain and stiffness.  ?Gastrointestinal: Negative for abdominal pain, bowel incontinence, diarrhea and excessive appetite.  ?Genitourinary: Negative for decreased libido, genital sores and incomplete emptying.  ?Neurological: Negative for brief paralysis, focal weakness, headaches and loss of balance.  ?Psychiatric/Behavioral: Negative for altered mental status, depression and suicidal ideas.  ?Allergic/Immunologic: Negative for HIV exposure and persistent infections.  ? ? ?  EKGs/Labs/Other Studies Reviewed:   ? ?The following studies were reviewed today: ? ? ?EKG: None today ? ?Recent Labs: ?09/08/2021: Hemoglobin 12.5; Platelets 374  ?Recent Lipid Panel ?   ?Component Value Date/Time  ? CHOL 170 04/25/2019 1013  ? TRIG 142 04/25/2019 1013  ? HDL 54 04/25/2019 1013  ? CHOLHDL 3.1 04/25/2019 1013  ? [place] 91 04/25/2019 1013  ? ? ?Physical Exam:   ? ?VS:  BP 134/90   Pulse 94   Ht '5\' 5"'$  (1.651 m)   Wt 189 lb 9.6 oz (86 kg)   SpO2 98%   BMI 31.55 kg/m?    ? ?Wt Readings from Last 3 Encounters:  ?10/13/21 189 lb 9.6 oz (86 kg)  ?09/08/21 188 lb 9.6 oz (85.5 kg)  ?08/11/21 188 lb 12.8 oz (85.6 kg)  ?  ? ?GEN: Well nourished, well developed in no acute distress ?HEENT: Normal ?NECK: No JVD; No carotid bruits ?LYMPHATICS: No lymphadenopathy ?CARDIAC: S1S2 noted,RRR, no murmurs, rubs, gallops ?RESPIRATORY:  Clear to auscultation without rales, wheezing or rhonchi  ?ABDOMEN: Soft, non-tender, non-distended, +bowel sounds, no guarding. ?EXTREMITIES: No edema, No cyanosis, no clubbing ?MUSCULOSKELETAL:   No deformity  ?SKIN: Warm and dry ?NEUROLOGIC:  Alert and oriented x 3, non-focal ?PSYCHIATRIC:  Normal affect, good insight ? ?ASSESSMENT:   ? ?1. PAF (paroxysmal atrial fibrillation) ([place])   ?2. Coronary a

## 2021-10-13 NOTE — Telephone Encounter (Signed)
Called pt to see if she will be seen here at [facility] by Dr. Harriet Masson or if she will be seen in [place] by another provider. No answer at this time, left a message for her to return the call.  ?

## 2021-10-13 NOTE — Telephone Encounter (Signed)
Called pt she states she wants to be seen by Dr. Harriet Masson. She does not want to transfer care to Dr. Agustin Cree.  ? ?

## 2021-10-13 NOTE — Patient Instructions (Addendum)
Medication Instructions:  Your physician recommends that you continue on your current medications as directed. Please refer to the Current Medication list given to you today.  *If you need a refill on your cardiac medications before your next appointment, please call your pharmacy*   Lab Work: None If you have labs (blood work) drawn today and your tests are completely normal, you will receive your results only by: MyChart Message (if you have MyChart) OR A paper copy in the mail If you have any lab test that is abnormal or we need to change your treatment, we will call you to review the results.   Testing/Procedures: ZIO XT- Long Term Monitor Instructions  Your physician has requested you wear a ZIO patch monitor for 14 days.  This is a single patch monitor. [employer] supplies one patch monitor per enrollment. Additional stickers are not available. Please do not apply patch if you will be having a Nuclear Stress Test,  Echocardiogram, Cardiac CT, MRI, or Chest Xray during the period you would be wearing the  monitor. The patch cannot be worn during these tests. You cannot remove and re-apply the  ZIO XT patch monitor.  Your ZIO patch monitor will be mailed 3 day USPS to your address on file. It may take 3-5 days  to receive your monitor after you have been enrolled.  Once you have received your monitor, please review the enclosed instructions. Your monitor  has already been registered assigning a specific monitor serial # to you.  Billing and Patient Assistance Program Information  We have supplied [employer] with any of your insurance information on file for billing purposes. [employer] offers a sliding scale Patient Assistance Program for patients that do not have  insurance, or whose insurance does not completely cover the cost of the ZIO monitor.  You must apply for the Patient Assistance Program to qualify for this discounted rate.  To apply, please call [employer] at [phone], select  option 4, select option 2, ask to apply for  Patient Assistance Program. [employer] will ask your household income, and how many people  are in your household. They will quote your out-of-pocket cost based on that information.  [employer] will also be able to set up a 12-month, interest-free payment plan if needed.  Applying the monitor   Shave hair from upper left chest.  Hold abrader disc by orange tab. Rub abrader in 40 strokes over the upper left chest as  indicated in your monitor instructions.  Clean area with 4 enclosed alcohol pads. Let dry.  Apply patch as indicated in monitor instructions. Patch will be placed under collarbone on left  side of chest with arrow pointing upward.  Rub patch adhesive wings for 2 minutes. Remove white label marked "1". Remove the white  label marked "2". Rub patch adhesive wings for 2 additional minutes.  While looking in a mirror, press and release button in center of patch. A small green light will  flash 3-4 times. This will be your only indicator that the monitor has been turned on.  Do not shower for the first 24 hours. You may shower after the first 24 hours.  Press the button if you feel a symptom. You will hear a small click. Record Date, Time and  Symptom in the Patient Logbook.  When you are ready to remove the patch, follow instructions on the last 2 pages of Patient  Logbook. Stick patch monitor onto the last page of Patient Logbook.  Place Patient Logbook in   the blue and white box. Use locking tab on box and tape box closed  securely. The blue and white box has prepaid postage on it. Please place it in the mailbox as  soon as possible. Your physician should have your test results approximately 7 days after the  monitor has been mailed back to [employer].  Call [employer] Customer Care at [phone] if you have questions regarding  your ZIO XT patch monitor. Call them immediately if you see an orange light blinking on your  monitor.   If your monitor falls off in less than 4 days, contact our Monitor department at [phone].  If your monitor becomes loose or falls off after 4 days call [employer] at [phone] for  suggestions on securing your monitor    Follow-Up: At [facility], you and your health needs are our priority.  As part of our continuing mission to provide you with exceptional heart care, we have created designated Provider Care Teams.  These Care Teams include your primary Cardiologist (physician) and Advanced Practice Providers (APPs -  Physician Assistants and Nurse Practitioners) who all work together to provide you with the care you need, when you need it.  We recommend signing up for the patient portal called "MyChart".  Sign up information is provided on this After Visit Summary.  MyChart is used to connect with patients for Virtual Visits (Telemedicine).  Patients are able to view lab/test results, encounter notes, upcoming appointments, etc.  Non-urgent messages can be sent to your provider as well.   To learn more about what you can do with MyChart, go to [email or web address].    Your next appointment:   6 month(s)  The format for your next appointment:   In Person  Provider:   Kardie Tobb, DO     Other Instructions   Important Information About Sugar       

## 2021-10-13 NOTE — Telephone Encounter (Signed)
Error

## 2021-10-13 NOTE — Progress Notes (Unsigned)
Enrolled for [employer] to mail a ZIO XT long term holter monitor to the patients address on file.  

## 2021-10-15 DIAGNOSIS — I480 Paroxysmal atrial fibrillation: Secondary | ICD-10-CM | POA: Diagnosis not present

## 2021-11-11 ENCOUNTER — Telehealth: Payer: Self-pay | Admitting: Cardiology

## 2021-11-11 MED ORDER — METOPROLOL SUCCINATE ER 25 MG PO TB24: 12.5000 mg | ORAL_TABLET | Freq: Every day | ORAL | 3 refills | Status: DC

## 2021-11-11 NOTE — Telephone Encounter (Signed)
Orvan July, RN  11/10/2021 10:32 AM EDT     Patient called.  Left message for patient to call back.   Berniece Salines, DO  11/10/2021  9:57 AM EDT     The monitor showed evidence of symptomatic occasional premature atrial complexes.  I would like to transition you to a longer acting beta-blocker stop the Lopressor and start Toprol XL 12.5 mg twice daily.   Patient called with results Rx(s) sent to pharmacy electronically.

## 2021-11-11 NOTE — Telephone Encounter (Signed)
Patient is returning call to discuss monitor results. 

## 2021-11-13 ENCOUNTER — Other Ambulatory Visit: Payer: Self-pay

## 2021-11-13 MED ORDER — METOPROLOL SUCCINATE ER 25 MG PO TB24: 12.5000 mg | ORAL_TABLET | Freq: Two times a day (BID) | ORAL | 3 refills | Status: DC

## 2021-12-11 ENCOUNTER — Other Ambulatory Visit: Payer: Self-pay | Admitting: Cardiology

## 2021-12-31 ENCOUNTER — Telehealth: Payer: Self-pay | Admitting: Cardiology

## 2021-12-31 ENCOUNTER — Encounter: Payer: Self-pay | Admitting: Cardiology

## 2021-12-31 NOTE — Telephone Encounter (Signed)
Pt c/o medication issue:  1. Name of Medication:   metoprolol succinate (TOPROL XL) 25 MG 24 hr tablet  2. How are you currently taking this medication (dosage and times per day)?  As prescribed  3. Are you having a reaction (difficulty breathing--STAT)?   No  4. What is your medication issue?   Patient called stating she is out of this medication now and the pharmacy will not refill this medication as it is too soon.

## 2021-12-31 NOTE — Telephone Encounter (Signed)
Returned a call from the pt's voice mail left at [facility] at [address]. Called pt and left a voice mail informing pt that her medication was sent to her pharmacy on 11/14/21 with a year supply. Called pt's pharmacy and the pharmacy tech is getting pt's medication ready for pt to pick up.

## 2021-12-31 NOTE — Telephone Encounter (Signed)
Spoke to patient. Informed patient  medication is ready  for pick  Metoprolol succinate 12.5 mg twice a day  # 90 with refills.  Patient verbalized understanding.

## 2022-03-23 ENCOUNTER — Telehealth: Payer: Self-pay | Admitting: Cardiology

## 2022-03-23 ENCOUNTER — Encounter: Payer: Self-pay | Admitting: Cardiology

## 2022-03-23 NOTE — Telephone Encounter (Signed)
03/23/22 Called mobile/home number to r/s appt due to Dr. Harriet Masson having a meeting but it is not working and called husbands number but his MB is not set up. Will send reminder letter to patient's home to call office to r/s - LCN

## 2022-03-25 ENCOUNTER — Inpatient Hospital Stay: Payer: Medicare Other | Attending: Oncology

## 2022-03-25 DIAGNOSIS — D509 Iron deficiency anemia, unspecified: Secondary | ICD-10-CM | POA: Insufficient documentation

## 2022-03-25 DIAGNOSIS — D500 Iron deficiency anemia secondary to blood loss (chronic): Secondary | ICD-10-CM

## 2022-03-25 LAB — CBC AND DIFFERENTIAL
HCT: 38 (ref 36–46)
Hemoglobin: 12.4 (ref 12.0–16.0)
Neutrophils Absolute: 5.31
Platelets: 399 10*3/uL (ref 150–400)
WBC: 8.3

## 2022-03-25 LAB — CBC: RBC: 4.81 (ref 3.87–5.11)

## 2022-03-26 NOTE — Progress Notes (Unsigned)
  [facility]  [address] [place],  [place]  [address] [phone]  Clinic Day:  03/27/2022  Referring physician: [facility]*  TELEPHONE VISIT  HISTORY OF PRESENT ILLNESS:  The patient is a 56 y.o. female with iron deficiency anemia, likely related to her heavy menstrual cycles.  However, the patient claims she has not had a menstrual cycle in 6 months.  She comes in today to reassess her iron and hemoglobin levels.  In the past, IV Feraheme was effective in replenishing her iron and normalizing her hemoglobin.  Overall, the patient claims to have occasional fatigue.  Outside of her menstrual cycles, she denies having any overt forms of blood loss since her last visit.  PHYSICAL EXAM:  DEFERRED    LABS:    Latest Reference Range & Units Most Recent 09/08/21 13:07  Iron 28 - 170 ug/dL 65 03/27/22 11:18 68  UIBC ug/dL 340 03/27/22 11:18 352  TIBC 250 - 450 ug/dL 405 03/27/22 11:18 420       Saturation Ratios 10.4 - 31.8 % 16 03/27/22 11:18 16  Ferritin 11 - 307 ng/mL 15 03/27/22 11:18 21  (E): External lab result   ASSESSMENT & PLAN:  Assessment/Plan:  A 56 y.o. female with iron deficiency anemia.  Her hemoglobin of 12.4 is essentially unchanged versus what it was at her last visit.   Her iron parameters are essentially unchanged versus what they have previously been. As that is the case and she is clinically doing well, I will see her back in 6 months for repeat clinical assessment.  The patient understands all the plans discussed today and is in agreement with them.    Katharina Jehle Macarthur Critchley, MD

## 2022-03-27 ENCOUNTER — Inpatient Hospital Stay (INDEPENDENT_AMBULATORY_CARE_PROVIDER_SITE_OTHER): Payer: Medicare Other | Admitting: Oncology

## 2022-03-27 ENCOUNTER — Other Ambulatory Visit: Payer: Self-pay | Admitting: Oncology

## 2022-03-27 DIAGNOSIS — D500 Iron deficiency anemia secondary to blood loss (chronic): Secondary | ICD-10-CM

## 2022-03-27 DIAGNOSIS — D509 Iron deficiency anemia, unspecified: Secondary | ICD-10-CM | POA: Diagnosis present

## 2022-03-27 LAB — IRON AND TIBC
Iron: 65 ug/dL (ref 28–170)
Saturation Ratios: 16 % (ref 10.4–31.8)
TIBC: 405 ug/dL (ref 250–450)
UIBC: 340 ug/dL

## 2022-03-27 LAB — FERRITIN: Ferritin: 15 ng/mL (ref 11–307)

## 2022-03-30 ENCOUNTER — Telehealth: Payer: Self-pay | Admitting: Oncology

## 2022-03-30 NOTE — Telephone Encounter (Signed)
03/30/22 spoke with patiet and scheduled [facility] f/u appt

## 2022-04-17 ENCOUNTER — Other Ambulatory Visit: Payer: Self-pay | Admitting: Cardiology

## 2022-04-20 ENCOUNTER — Ambulatory Visit: Payer: Medicare Other | Admitting: Cardiology

## 2022-04-22 ENCOUNTER — Encounter: Payer: Self-pay | Admitting: Cardiology

## 2022-04-22 ENCOUNTER — Ambulatory Visit: Payer: Medicare Other | Attending: Cardiology | Admitting: Cardiology

## 2022-04-22 VITALS — BP 130/88 | HR 111 | Ht 65.0 in | Wt 183.0 lb

## 2022-04-22 DIAGNOSIS — E782 Mixed hyperlipidemia: Secondary | ICD-10-CM

## 2022-04-22 DIAGNOSIS — I2510 Atherosclerotic heart disease of native coronary artery without angina pectoris: Secondary | ICD-10-CM | POA: Diagnosis not present

## 2022-04-22 DIAGNOSIS — R0789 Other chest pain: Secondary | ICD-10-CM | POA: Diagnosis not present

## 2022-04-22 DIAGNOSIS — I10 Essential (primary) hypertension: Secondary | ICD-10-CM | POA: Diagnosis not present

## 2022-04-22 DIAGNOSIS — J9611 Chronic respiratory failure with hypoxia: Secondary | ICD-10-CM

## 2022-04-22 DIAGNOSIS — Z79899 Other long term (current) drug therapy: Secondary | ICD-10-CM

## 2022-04-22 DIAGNOSIS — G4733 Obstructive sleep apnea (adult) (pediatric): Secondary | ICD-10-CM

## 2022-04-22 MED ORDER — METOPROLOL SUCCINATE ER 25 MG PO TB24: ORAL_TABLET | ORAL | 3 refills | Status: DC

## 2022-04-22 NOTE — Patient Instructions (Signed)
Medication Instructions:  Your physician has recommended you make the following change in your medication: INCREASE: Toprol '25mg'$  in the AM and 12.'5mg'$  in the PM   *If you need a refill on your cardiac medications before your next appointment, please call your pharmacy*   Lab Work: Your physician recommends that you have the following labs drawn today BMET and Magnesium  If you have labs (blood work) drawn today and your tests are completely normal, you will receive your results only by: [place] (if you have MyChart) OR A paper copy in the mail If you have any lab test that is abnormal or we need to change your treatment, we will call you to review the results.   Testing/Procedures: NONE   Follow-Up: At [facility], you and your health needs are our priority.  As part of our continuing mission to provide you with exceptional heart care, we have created designated Provider Care Teams.  These Care Teams include your primary Cardiologist (physician) and Advanced Practice Providers (APPs -  Physician Assistants and Nurse Practitioners) who all work together to provide you with the care you need, when you need it.  We recommend signing up for the patient portal called "MyChart".  Sign up information is provided on this After Visit Summary.  MyChart is used to connect with patients for Virtual Visits (Telemedicine).  Patients are able to view lab/test results, encounter notes, upcoming appointments, etc.  Non-urgent messages can be sent to your provider as well.   To learn more about what you can do with MyChart, go to [email or web address].    Your next appointment:   9 month(s)  The format for your next appointment:   In Person  Provider:   Berniece Salines, DO

## 2022-04-22 NOTE — Progress Notes (Signed)
Cardiology Office Note:    Date:  04/23/2022   ID:  Carol Cummings, DOB September 11, 1965, MRN [id number]  PCP:  [facility]  Cardiologist:  Berniece Salines, DO  Electrophysiologist:  None   Referring MD: [facility]*   " I am doing   History of Present Illness:    Carol Cummings is a 56 y.o. 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   I saw the patient on September 07, 2018.  At that time she was still experiencing baseline shortness of breath.  I saw the patient on Oct 13, 2021 she was doing well.  But she had seen Dr. Geraldo Pitter transition only.  She has had been diagnosed with atrial fibrillation and placed a monitor on the patient.  Her monitor in the meantime did not show any evidence of atrial fibrillation.  No specific complaints today.  Past Medical History:  Diagnosis Date   Abnormal PFT 04/26/2013   Anxiety 11/28/2012   Anxiety and depression 07/30/2021   Atrial fibrillation with RVR ([place]) 07/30/2021   Bilateral leg edema 07/26/2019   Bronchopneumonia 07/30/2021   Chest tightness or pressure 11/28/2012   Chronic constipation 11/28/2012   Chronic obstructive pulmonary disease, unspecified ([place]) 07/12/2007   Qualifier: Diagnosis of  By: Melvyn Novas MD, Anselm Jungling of this note might be different from the original. Overview:  Qualifier: Diagnosis of  By: Melvyn Novas MD, Michael B   Chronic respiratory failure ([place]) 11/28/2012   Coronary artery disease involving native coronary artery of native heart 09/08/2019   EKG abnormalities 07/30/2021   Emphysema lung ([place])    Essential hypertension 09/08/2019   Family history of early CAD 11/28/2012    Hyperlipidemia 08/31/2017   Hypertension    Iron deficiency anemia due to chronic blood loss 09/25/2020   O2 dependent 08/31/2017   2-6 L   Obesity (BMI 30-39.9) 09/06/2020   Obesity, unspecified 07/12/2007   Qualifier: Diagnosis of  By: Melvyn Novas MD, Anselm Jungling of this note might be different from the original. Overview:  Qualifier: Diagnosis of  By: Melvyn Novas MD, Legrand Como B   OSA (obstructive sleep apnea) 07/30/2021   Overweight 11/28/2012   Oxygen dependent    2-6 L   Palpitations 08/31/2017   Shortness of breath 07/26/2019   Sinus bradycardia 07/26/2019   Sinus tachycardia 08/31/2017   Sore throat 11/28/2012    Past Surgical History:  Procedure Laterality Date   BLADDER SUSPENSION     large intestine surgery     LUNG LOBECTOMY     both upper lobes   NASAL SEPTUM SURGERY     TUBAL LIGATION      Current Medications: Current Meds  Medication Sig   acetaminophen (TYLENOL) 500 MG tablet Take 500 mg by mouth every 6 (six) hours as needed for mild pain or moderate pain.   albuterol (PROVENTIL) (2.5 MG/3ML) 0.083% nebulizer solution Take 2.5 mg by nebulization 4 (four) times daily.   albuterol (VENTOLIN HFA) 108 (90 Base) MCG/ACT inhaler Inhale 1 puff into the lungs 3 (three) times daily as needed for wheezing or shortness of breath.   ALPRAZolam (XANAX) 1 MG tablet Take 1 mg by mouth 2 (two) times daily.  aspirin EC 81 MG tablet Take 1 tablet (81 mg total) by mouth daily.   azelastine (ASTELIN) 0.1 % nasal spray Place 1 spray into both nostrils 2 (two) times daily.   budesonide (PULMICORT) 0.5 MG/2ML nebulizer solution Take 0.5 mg by nebulization 2 (two) times daily.   buPROPion (WELLBUTRIN SR) 150 MG 12 hr tablet Take 150 mg by mouth 2 (two) times daily.   Fluticasone-Umeclidin-Vilant (TRELEGY ELLIPTA) 200-62.5-25 MCG/INH AEPB Inhale 25 mcg into the lungs daily.   furosemide (LASIX) 20 MG tablet TAKE 1 TABLET BY MOUTH ON TUESDAY AND SATURDAY   guaiFENesin (MUCINEX) 600 MG 12 hr tablet  Take 600 mg by mouth as needed for cough or to loosen phlegm. For cough   HIZENTRA 10 GM/50ML SOLN Inject into the skin once a week.   ipratropium-albuterol (DUONEB) 0.5-2.5 (3) MG/3ML SOLN Inhale 3 mLs into the lungs 4 (four) times daily.   loratadine (CLARITIN) 10 MG tablet Take 10 mg by mouth daily.   pantoprazole (PROTONIX) 40 MG tablet Take 40 mg by mouth daily.   rosuvastatin (CRESTOR) 20 MG tablet Take 20 mg by mouth at bedtime.   [DISCONTINUED] metoprolol succinate (TOPROL XL) 25 MG 24 hr tablet Take 0.5 tablets (12.5 mg total) by mouth in the morning and at bedtime.     Allergies:   Topiramate and Phentermine   Social History   Socioeconomic History   Marital status: Married    Spouse name: Not on file   Number of children: Not on file   Years of education: Not on file   Highest education level: Not on file  Occupational History   Not on file  Tobacco Use   Smoking status: Former   Smokeless tobacco: Never  Vaping Use   Vaping Use: Never used  Substance and Sexual Activity   Alcohol use: Not Currently   Drug use: Never   Sexual activity: Not on file  Other Topics Concern   Not on file  Social History Narrative   Not on file   Social Determinants of Health   Financial Resource Strain: Not on file  Food Insecurity: Not on file  Transportation Needs: Not on file  Physical Activity: Not on file  Stress: Not on file  Social Connections: Not on file     Family History: The patient's family history includes Aneurysm in her sister; Diabetes in her mother; Emphysema in her brother and father; Heart attack in her father; Hyperlipidemia in her mother; Hypertension in her mother; Stomach cancer in her mother; Stroke in her mother.  ROS:   Review of Systems  Constitution: Negative for decreased appetite, fever and weight gain.  HENT: Negative for congestion, ear discharge, hoarse voice and sore throat.   Eyes: Negative for discharge, redness, vision loss in right eye  and visual halos.  Cardiovascular: Negative for chest pain, dyspnea on exertion, leg swelling, orthopnea and palpitations.  Respiratory: Negative for cough, hemoptysis, shortness of breath and snoring.   Endocrine: Negative for heat intolerance and polyphagia.  Hematologic/Lymphatic: Negative for bleeding problem. Does not bruise/bleed easily.  Skin: Negative for flushing, nail changes, rash and suspicious lesions.  Musculoskeletal: Negative for arthritis, joint pain, muscle cramps, myalgias, neck pain and stiffness.  Gastrointestinal: Negative for abdominal pain, bowel incontinence, diarrhea and excessive appetite.  Genitourinary: Negative for decreased libido, genital sores and incomplete emptying.  Neurological: Negative for brief paralysis, focal weakness, headaches and loss of balance.  Psychiatric/Behavioral: Negative for altered mental status, depression and suicidal ideas.  Allergic/Immunologic:  Negative for HIV exposure and persistent infections.    EKGs/Labs/Other Studies Reviewed:    The following studies were reviewed today:   EKG: None today  ZIO monitor Nov 05, 2021  Patch Wear Time:  12 days and 21 hours (2023-05-10T21:31:58-0400 to 2023-05-23T19:29:33-0400)   Patient had a min HR of 62 bpm, max HR of 121 bpm, and avg HR of 85 bpm. Predominant underlying rhythm was Sinus Rhythm. Isolated SVEs were occasional (1.3%, 16501), SVE Couplets were rare (<1.0%, 4), and no SVE Triplets were present. Isolated VEs were  rare (<1.0%), VE Couplets were rare (<1.0%), and no VE Triplets were present. Ventricular Bigeminy and Trigeminy were present.    Symptoms associated with premature atrial complex.   Conclusion: This study is remarkable for symptomatic occasional premature atrial complexes.  Recent Labs: 03/25/2022: Hemoglobin 12.4; Platelets 399 04/22/2022: BUN 14; Creatinine, Ser 0.96; Magnesium 2.2; Potassium 4.4; Sodium 142  Recent Lipid Panel    Component Value Date/Time    CHOL 170 04/25/2019 1013   TRIG 142 04/25/2019 1013   HDL 54 04/25/2019 1013   CHOLHDL 3.1 04/25/2019 1013   LDLCALC 91 04/25/2019 1013    Physical Exam:    VS:  BP 130/88 (BP Location: Right Arm, Patient Position: Sitting, Cuff Size: Normal)   Pulse (!) 111   Ht '5\' 5"'$  (1.651 m)   Wt 183 lb (83 kg)   BMI 30.45 kg/m     Wt Readings from Last 3 Encounters:  04/22/22 183 lb (83 kg)  10/13/21 189 lb 9.6 oz (86 kg)  09/08/21 188 lb 9.6 oz (85.5 kg)     GEN: Well nourished, well developed in no acute distress HEENT: Normal NECK: No JVD; No carotid bruits LYMPHATICS: No lymphadenopathy CARDIAC: S1S2 noted,RRR, no murmurs, rubs, gallops RESPIRATORY:  Clear to auscultation without rales, wheezing or rhonchi  ABDOMEN: Soft, non-tender, non-distended, +bowel sounds, no guarding. EXTREMITIES: No edema, No cyanosis, no clubbing MUSCULOSKELETAL:  No deformity  SKIN: Warm and dry NEUROLOGIC:  Alert and oriented x 3, non-focal PSYCHIATRIC:  Normal affect, good insight  ASSESSMENT:    1. Atypical chest pain   2. Medication management   3. Coronary artery disease involving native coronary artery of native heart without angina pectoris   4. Primary hypertension   5. Chronic respiratory failure with hypoxia (HCC)   6. OSA (obstructive sleep apnea)   7. Mixed hyperlipidemia     PLAN:    Monitor did not show any atrial fibrillation but she does have symptomatic occasional premature atrial complex.  CAD-no anginal symptoms.  Blood pressure is acceptable, continue with current antihypertensive regimen.  She is on chronic oxygen.  The patient is in agreement with the above plan. The patient left the office in stable condition.  The patient will follow up in 9 months or sooner if needed.   Medication Adjustments/Labs and Tests Ordered: Current medicines are reviewed at length with the patient today.  Concerns regarding medicines are outlined above.  Orders Placed This Encounter   Procedures   Magnesium   Basic Metabolic Panel (BMET)   EKG 12-Lead   Meds ordered this encounter  Medications   metoprolol succinate (TOPROL XL) 25 MG 24 hr tablet    Sig: Take '25mg'$  in the am (1 tablet) and 12.'5mg'$  in the evening (0.5 tablet)    Dispense:  135 tablet    Refill:  3    Patient Instructions  Medication Instructions:  Your physician has recommended you make the following change in  your medication: INCREASE: Toprol '25mg'$  in the AM and 12.'5mg'$  in the PM   *If you need a refill on your cardiac medications before your next appointment, please call your pharmacy*   Lab Work: Your physician recommends that you have the following labs drawn today BMET and Magnesium  If you have labs (blood work) drawn today and your tests are completely normal, you will receive your results only by: [place] (if you have MyChart) OR A paper copy in the mail If you have any lab test that is abnormal or we need to change your treatment, we will call you to review the results.   Testing/Procedures: NONE   Follow-Up: At [facility], you and your health needs are our priority.  As part of our continuing mission to provide you with exceptional heart care, we have created designated Provider Care Teams.  These Care Teams include your primary Cardiologist (physician) and Advanced Practice Providers (APPs -  Physician Assistants and Nurse Practitioners) who all work together to provide you with the care you need, when you need it.  We recommend signing up for the patient portal called "MyChart".  Sign up information is provided on this After Visit Summary.  MyChart is used to connect with patients for Virtual Visits (Telemedicine).  Patients are able to view lab/test results, encounter notes, upcoming appointments, etc.  Non-urgent messages can be sent to your provider as well.   To learn more about what you can do with MyChart, go to [email or web address].    Your next  appointment:   9 month(s)  The format for your next appointment:   In Person  Provider:   Berniece Salines, DO     Adopting a Healthy Lifestyle.  Know what a healthy weight is for you (roughly BMI <25) and aim to maintain this   Aim for 7+ servings of fruits and vegetables daily   65-80+ fluid ounces of water or unsweet tea for healthy kidneys   Limit to max 1 drink of alcohol per day; avoid smoking/tobacco   Limit animal fats in diet for cholesterol and heart health - choose grass fed whenever available   Avoid highly processed foods, and foods high in saturated/trans fats   Aim for low stress - take time to unwind and care for your mental health   Aim for 150 min of moderate intensity exercise weekly for heart health, and weights twice weekly for bone health   Aim for 7-9 hours of sleep daily   When it comes to diets, agreement about the perfect plan isnt easy to find, even among the experts. Experts at the [employer] developed an idea known as the Healthy Eating Plate. Just imagine a plate divided into logical, healthy portions.   The emphasis is on diet quality:   Load up on vegetables and fruits - one-half of your plate: Aim for color and variety, and remember that potatoes dont count.   Go for whole grains - one-quarter of your plate: Whole wheat, barley, wheat berries, quinoa, oats, brown rice, and foods made with them. If you want pasta, go with whole wheat pasta.   Protein power - one-quarter of your plate: Fish, chicken, beans, and nuts are all healthy, versatile protein sources. Limit red meat.   The diet, however, does go beyond the plate, offering a few other suggestions.   Use healthy plant oils, such as olive, canola, soy, corn, sunflower and peanut. Check the labels, and avoid partially hydrogenated oil,  which have unhealthy trans fats.   If youre thirsty, drink water. Coffee and tea are good in moderation, but skip sugary drinks and  limit milk and dairy products to one or two daily servings.   The type of carbohydrate in the diet is more important than the amount. Some sources of carbohydrates, such as vegetables, fruits, whole grains, and beans-are healthier than others.   Finally, stay active  Signed, Berniece Salines, DO  04/23/2022 10:31 AM    [place]

## 2022-04-23 LAB — BASIC METABOLIC PANEL
BUN/Creatinine Ratio: 15 (ref 9–23)
BUN: 14 mg/dL (ref 6–24)
CO2: 29 mmol/L (ref 20–29)
Calcium: 10.1 mg/dL (ref 8.7–10.2)
Chloride: 97 mmol/L (ref 96–106)
Creatinine, Ser: 0.96 mg/dL (ref 0.57–1.00)
Glucose: 114 mg/dL — ABNORMAL HIGH (ref 70–99)
Potassium: 4.4 mmol/L (ref 3.5–5.2)
Sodium: 142 mmol/L (ref 134–144)
eGFR: 70 mL/min/{1.73_m2} (ref >59)

## 2022-04-23 LAB — MAGNESIUM: Magnesium: 2.2 mg/dL (ref 1.6–2.3)

## 2022-08-11 ENCOUNTER — Encounter: Payer: Self-pay | Admitting: Cardiology

## 2022-08-11 MED ORDER — METOPROLOL SUCCINATE ER 25 MG PO TB24: ORAL_TABLET | ORAL | 3 refills | Status: DC

## 2022-09-28 ENCOUNTER — Ambulatory Visit: Payer: Medicare Other | Admitting: Oncology

## 2022-09-28 ENCOUNTER — Other Ambulatory Visit: Payer: Medicare Other

## 2022-10-06 ENCOUNTER — Inpatient Hospital Stay: Payer: 59 | Attending: Oncology

## 2022-10-06 ENCOUNTER — Inpatient Hospital Stay (INDEPENDENT_AMBULATORY_CARE_PROVIDER_SITE_OTHER): Payer: 59 | Admitting: Oncology

## 2022-10-06 VITALS — BP 131/65 | HR 104 | Temp 98.2°F | Resp 22 | Wt 176.6 lb

## 2022-10-06 DIAGNOSIS — D500 Iron deficiency anemia secondary to blood loss (chronic): Secondary | ICD-10-CM

## 2022-10-06 DIAGNOSIS — D509 Iron deficiency anemia, unspecified: Secondary | ICD-10-CM | POA: Diagnosis present

## 2022-10-06 LAB — CBC AND DIFFERENTIAL
HCT: 38 (ref 36–46)
Hemoglobin: 12.5 (ref 12.0–16.0)
Neutrophils Absolute: 6.79
Platelets: 386 10*3/uL (ref 150–400)
WBC: 9.3

## 2022-10-06 LAB — IRON AND TIBC
Iron: 43 ug/dL (ref 28–170)
Saturation Ratios: 9 % — ABNORMAL LOW (ref 10.4–31.8)
TIBC: 493 ug/dL — ABNORMAL HIGH (ref 250–450)
UIBC: 450 ug/dL

## 2022-10-06 LAB — FERRITIN: Ferritin: 13 ng/mL (ref 11–307)

## 2022-10-06 LAB — CBC: RBC: 4.71 (ref 3.87–5.11)

## 2022-10-13 ENCOUNTER — Other Ambulatory Visit: Payer: Self-pay | Admitting: Oncology

## 2022-10-13 ENCOUNTER — Encounter: Payer: Self-pay | Admitting: Oncology

## 2022-10-13 DIAGNOSIS — D500 Iron deficiency anemia secondary to blood loss (chronic): Secondary | ICD-10-CM

## 2022-10-13 NOTE — Progress Notes (Signed)
He may have 1 [facility]  [address] [place],  [place]  [address] [phone]  Clinic Day:  10/06/2022  Referring physician: [facility]*   HISTORY OF PRESENT ILLNESS:  The patient is a 57 y.o. female with iron deficiency anemia, likely related to previously heavy menstrual cycles.  She comes in today to reassess her iron and hemoglobin levels.  In the past, IV Feraheme was effective in replenishing her iron and normalizing her hemoglobin.  Overall, the patient claims to be doing well.  She denies having any overt forms of blood loss since her last visit.  PHYSICAL EXAM:  Blood pressure 131/65, pulse (!) 104, temperature 98.2 F (36.8 C), temperature source Oral, resp. rate (!) 22, weight 176 lb 9.6 oz (80.1 kg), SpO2 95 %. Wt Readings from Last 3 Encounters:  10/06/22 176 lb 9.6 oz (80.1 kg)  04/22/22 183 lb (83 kg)  10/13/21 189 lb 9.6 oz (86 kg)   Body mass index is 29.39 kg/m. Performance status (ECOG): 0 - Asymptomatic Physical Exam Constitutional:      Appearance: Normal appearance. She is not ill-appearing.  HENT:     Mouth/Throat:     Mouth: Mucous membranes are moist.     Pharynx: Oropharynx is clear. No oropharyngeal exudate or posterior oropharyngeal erythema.  Cardiovascular:     Rate and Rhythm: Normal rate and regular rhythm.     Heart sounds: No murmur heard.    No friction rub. No gallop.  Pulmonary:     Effort: Pulmonary effort is normal. No respiratory distress.     Breath sounds: Normal breath sounds. No wheezing, rhonchi or rales.  Abdominal:     General: Bowel sounds are normal. There is no distension.     Palpations: Abdomen is soft. There is no mass.     Tenderness: There is no abdominal tenderness.  Musculoskeletal:        General: No swelling.     Right lower leg: No edema.     Left lower leg: No edema.  Lymphadenopathy:     Cervical: No cervical adenopathy.     Upper Body:     Right upper  body: No supraclavicular or axillary adenopathy.     Left upper body: No supraclavicular or axillary adenopathy.     Lower Body: No right inguinal adenopathy. No left inguinal adenopathy.  Skin:    General: Skin is warm.     Coloration: Skin is not jaundiced.     Findings: No lesion or rash.  Neurological:     General: No focal deficit present.     Mental Status: She is alert and oriented to person, place, and time. Mental status is at baseline.  Psychiatric:        Mood and Affect: Mood normal.        Behavior: Behavior normal.        Thought Content: Thought content normal.    LABS:      Latest Ref Rng & Units 10/06/2022   12:00 AM 03/25/2022   12:00 AM 09/08/2021   12:00 AM  CBC  WBC  9.3     8.3     9.6      Hemoglobin 12.0 - 16.0 12.5     12.4     12.5      Hematocrit 36 - 46 38     38     39      Platelets 150 - 400 K/uL 386  399     374         This result is from an external source.      Latest Ref Rng & Units 04/22/2022    4:08 PM 09/19/2020    8:29 AM 09/06/2020    1:13 PM  CMP  Glucose 70 - 99 mg/dL 119  147  93   BUN 6 - 24 mg/dL 14  10  10    Creatinine 0.57 - 1.00 mg/dL 8.29  5.62  1.30   Sodium 134 - 144 mmol/L 142  139  142   Potassium 3.5 - 5.2 mmol/L 4.4  4.5  4.0   Chloride 96 - 106 mmol/L 97  104  101   CO2 20 - 29 mmol/L 29  27  25    Calcium 8.7 - 10.2 mg/dL 86.5  9.7  9.3     Latest Reference Range & Units 10/06/22 10:26  Iron 28 - 170 ug/dL 43  UIBC ug/dL 784  TIBC 696 - 295 ug/dL 284 (H)  Saturation Ratios 10.4 - 31.8 % 9 (L)  Ferritin 11 - 307 ng/mL 13  (H): Data is abnormally high (L): Data is abnormally low   ASSESSMENT & PLAN:  Assessment/Plan:  A 57 y.o. female with iron deficiency anemia.  Although her hemoglobin is essentially unchanged since her last visit, her iron levels are clearly fallen.  Based upon this, I will arrange for her to receive IV iron over these next few weeks to replenish her iron stores.  Otherwise, I will see her  back in 4 months to reassess her iron and hemoglobin levels to see how well she responded to her upcoming IV iron.  The patient understands all the plans discussed today and is in agreement with them.    Pink Maye Kirby Funk, MD

## 2022-10-14 ENCOUNTER — Telehealth: Payer: Self-pay | Admitting: Oncology

## 2022-10-14 NOTE — Telephone Encounter (Signed)
Contacted pt to schedule an appt. Unable to reach via phone, voicemail was left.   Scheduling Message Entered by Rennis Harding A on 10/13/2022 at  4:11 PM Priority: Routine <No visit type provided>  Department: [facility]  Provider:  Scheduling Notes:  Please arrange for pt to receive IV iron in the forthcoming week.  Have pt see me back on 02-05-23 with labs

## 2022-10-15 ENCOUNTER — Inpatient Hospital Stay: Payer: 59 | Attending: Oncology

## 2022-10-15 VITALS — BP 123/50 | HR 82 | Temp 98.5°F | Resp 20 | Ht 65.0 in | Wt 179.1 lb

## 2022-10-15 DIAGNOSIS — D509 Iron deficiency anemia, unspecified: Secondary | ICD-10-CM | POA: Diagnosis present

## 2022-10-15 DIAGNOSIS — D500 Iron deficiency anemia secondary to blood loss (chronic): Secondary | ICD-10-CM

## 2022-10-15 MED ORDER — SODIUM CHLORIDE 0.9 % IV SOLN: Freq: Once | INTRAVENOUS | Status: AC

## 2022-10-15 MED ORDER — SODIUM CHLORIDE 0.9 % IV SOLN
510.0000 mg | Freq: Once | INTRAVENOUS | Status: AC
  Administered 2022-10-15: 510 mg via INTRAVENOUS
  Filled 2022-10-15: qty 510

## 2022-10-15 NOTE — Patient Instructions (Signed)

## 2022-10-19 MED FILL — Ferumoxytol Inj 510 MG/17ML (30 MG/ML) (Elemental Fe): INTRAVENOUS | Qty: 17 | Status: AC

## 2022-10-20 ENCOUNTER — Inpatient Hospital Stay: Payer: 59

## 2022-10-20 VITALS — BP 128/70 | HR 81 | Temp 98.0°F | Resp 18

## 2022-10-20 DIAGNOSIS — D509 Iron deficiency anemia, unspecified: Secondary | ICD-10-CM | POA: Diagnosis not present

## 2022-10-20 DIAGNOSIS — D500 Iron deficiency anemia secondary to blood loss (chronic): Secondary | ICD-10-CM

## 2022-10-20 MED ORDER — SODIUM CHLORIDE 0.9 % IV SOLN
510.0000 mg | Freq: Once | INTRAVENOUS | Status: AC
  Administered 2022-10-20: 510 mg via INTRAVENOUS
  Filled 2022-10-20: qty 510

## 2022-10-20 MED ORDER — SODIUM CHLORIDE 0.9 % IV SOLN: Freq: Once | INTRAVENOUS | Status: AC

## 2022-10-20 NOTE — Patient Instructions (Signed)

## 2023-01-13 ENCOUNTER — Other Ambulatory Visit: Payer: Self-pay | Admitting: Cardiology

## 2023-02-05 ENCOUNTER — Inpatient Hospital Stay: Payer: 59

## 2023-02-05 ENCOUNTER — Inpatient Hospital Stay: Payer: 59 | Admitting: Oncology

## 2023-02-14 NOTE — Progress Notes (Signed)
He may have 1 [facility]  [address] [place],  [place]  [address] [phone]  Clinic Day:  02/15/2023  Referring physician: Grayland Jack, *   HISTORY OF PRESENT ILLNESS:  The patient is a 57 y.o. female with iron deficiency anemia, likely related to previously heavy menstrual cycles.  She comes in today to reassess her iron and hemoglobin levels after receiving IV Feraheme in May 2024.  Overall, the patient claims to feel much better since receiving her IV iron.  She denies having any overt forms of blood loss since her last visit.  PHYSICAL EXAM:  Blood pressure 129/62, pulse 100, temperature 98.2 F (36.8 C), resp. rate 20, height 5\' 5"  (1.651 m), weight 179 lb 9.6 oz (81.5 kg), SpO2 95%. Wt Readings from Last 3 Encounters:  02/15/23 179 lb 9.6 oz (81.5 kg)  10/15/22 179 lb 1.3 oz (81.2 kg)  10/06/22 176 lb 9.6 oz (80.1 kg)   Body mass index is 29.89 kg/m. Performance status (ECOG): 0 - Asymptomatic Physical Exam Constitutional:      Appearance: Normal appearance. She is not ill-appearing.  HENT:     Mouth/Throat:     Mouth: Mucous membranes are moist.     Pharynx: Oropharynx is clear. No oropharyngeal exudate or posterior oropharyngeal erythema.  Cardiovascular:     Rate and Rhythm: Normal rate and regular rhythm.     Heart sounds: No murmur heard.    No friction rub. No gallop.  Pulmonary:     Effort: Pulmonary effort is normal. No respiratory distress.     Breath sounds: Normal breath sounds. No wheezing, rhonchi or rales.  Abdominal:     General: Bowel sounds are normal. There is no distension.     Palpations: Abdomen is soft. There is no mass.     Tenderness: There is no abdominal tenderness.  Musculoskeletal:        General: No swelling.     Right lower leg: No edema.     Left lower leg: No edema.  Lymphadenopathy:     Cervical: No cervical adenopathy.     Upper Body:     Right upper body: No supraclavicular or  axillary adenopathy.     Left upper body: No supraclavicular or axillary adenopathy.     Lower Body: No right inguinal adenopathy. No left inguinal adenopathy.  Skin:    General: Skin is warm.     Coloration: Skin is not jaundiced.     Findings: No lesion or rash.  Neurological:     General: No focal deficit present.     Mental Status: She is alert and oriented to person, place, and time. Mental status is at baseline.  Psychiatric:        Mood and Affect: Mood normal.        Behavior: Behavior normal.        Thought Content: Thought content normal.    LABS:      Latest Ref Rng & Units 02/15/2023   12:00 AM 10/06/2022   12:00 AM 03/25/2022   12:00 AM  CBC  WBC  21.1     9.3     8.3      Hemoglobin 12.0 - 16.0 15.2     12.5     12.4      Hematocrit 36 - 46 45     38     38      Platelets 150 - 400 K/uL 285  386     399         This result is from an external source.      Latest Ref Rng & Units 04/22/2022    4:08 PM 09/19/2020    8:29 AM 09/06/2020    1:13 PM  CMP  Glucose 70 - 99 mg/dL 188  416  93   BUN 6 - 24 mg/dL 14  10  10    Creatinine 0.57 - 1.00 mg/dL 6.06  3.01  6.01   Sodium 134 - 144 mmol/L 142  139  142   Potassium 3.5 - 5.2 mmol/L 4.4  4.5  4.0   Chloride 96 - 106 mmol/L 97  104  101   CO2 20 - 29 mmol/L 29  27  25    Calcium 8.7 - 10.2 mg/dL 09.3  9.7  9.3     Latest Reference Range & Units 10/06/22 10:26 02/15/23 13:52  Iron 28 - 170 ug/dL 43 62  UIBC ug/dL 235 573  TIBC 220 - 254 ug/dL 270 (H) 623  Saturation Ratios 10.4 - 31.8 % 9 (L) 19  Ferritin 11 - 307 ng/mL 13 244  (H): Data is abnormally high (L): Data is abnormally low   ASSESSMENT & PLAN:  Assessment/Plan:  A 57 y.o. female with iron deficiency anemia.  I am very pleased with the improvement in both her iron and hemoglobin levels since she received another course of IV iron in May 2024.  Clinically, the patient is doing much better.  As that is the case, I will see her back in 6 months for  repeat clinical assessment.  The patient understands all the plans discussed today and is in agreement with them.    Raymund Manrique Kirby Funk, MD

## 2023-02-15 ENCOUNTER — Inpatient Hospital Stay (INDEPENDENT_AMBULATORY_CARE_PROVIDER_SITE_OTHER): Payer: 59 | Admitting: Oncology

## 2023-02-15 ENCOUNTER — Inpatient Hospital Stay: Payer: 59 | Attending: Oncology

## 2023-02-15 ENCOUNTER — Other Ambulatory Visit: Payer: Self-pay | Admitting: Oncology

## 2023-02-15 VITALS — BP 129/62 | HR 100 | Temp 98.2°F | Resp 20 | Ht 65.0 in | Wt 179.6 lb

## 2023-02-15 DIAGNOSIS — D509 Iron deficiency anemia, unspecified: Secondary | ICD-10-CM | POA: Diagnosis present

## 2023-02-15 DIAGNOSIS — D500 Iron deficiency anemia secondary to blood loss (chronic): Secondary | ICD-10-CM

## 2023-02-15 LAB — IRON AND TIBC
Iron: 62 ug/dL (ref 28–170)
Saturation Ratios: 19 % (ref 10.4–31.8)
TIBC: 319 ug/dL (ref 250–450)
UIBC: 257 ug/dL

## 2023-02-15 LAB — CBC AND DIFFERENTIAL
HCT: 45 (ref 36–46)
Hemoglobin: 15.2 (ref 12.0–16.0)
Neutrophils Absolute: 17.51
Platelets: 285 10*3/uL (ref 150–400)
WBC: 21.1

## 2023-02-15 LAB — FERRITIN: Ferritin: 244 ng/mL (ref 11–307)

## 2023-02-15 LAB — CBC: RBC: 5.02 (ref 3.87–5.11)

## 2023-02-15 NOTE — Progress Notes (Signed)
Patient c/o undexplained bruising on arms, legs and face.

## 2023-03-23 ENCOUNTER — Encounter: Payer: Self-pay | Admitting: Oncology

## 2023-03-25 ENCOUNTER — Emergency Department (HOSPITAL_COMMUNITY): Payer: 59

## 2023-03-25 ENCOUNTER — Emergency Department (HOSPITAL_COMMUNITY)
Admission: EM | Admit: 2023-03-25 | Discharge: 2023-03-26 | Discharge status: Against Medical Advice | Payer: 59 | Attending: Emergency Medicine | Admitting: Emergency Medicine

## 2023-03-25 ENCOUNTER — Encounter (HOSPITAL_COMMUNITY): Payer: Self-pay | Admitting: Emergency Medicine

## 2023-03-25 ENCOUNTER — Other Ambulatory Visit: Payer: Self-pay

## 2023-03-25 DIAGNOSIS — Z5321 Procedure and treatment not carried out due to patient leaving prior to being seen by health care provider: Secondary | ICD-10-CM | POA: Insufficient documentation

## 2023-03-25 DIAGNOSIS — R110 Nausea: Secondary | ICD-10-CM | POA: Diagnosis not present

## 2023-03-25 DIAGNOSIS — K5900 Constipation, unspecified: Secondary | ICD-10-CM | POA: Diagnosis not present

## 2023-03-25 DIAGNOSIS — R1032 Left lower quadrant pain: Secondary | ICD-10-CM | POA: Insufficient documentation

## 2023-03-25 LAB — CBC WITH DIFFERENTIAL/PLATELET
Abs Immature Granulocytes: 0.09 10*3/uL — ABNORMAL HIGH (ref 0.00–0.07)
Basophils Absolute: 0.1 10*3/uL (ref 0.0–0.1)
Basophils Relative: 1 %
Eosinophils Absolute: 0.2 10*3/uL (ref 0.0–0.5)
Eosinophils Relative: 2 %
HCT: 40.1 % (ref 36.0–46.0)
Hemoglobin: 12.9 g/dL (ref 12.0–15.0)
Immature Granulocytes: 1 %
Lymphocytes Relative: 22 %
Lymphs Abs: 2.3 10*3/uL (ref 0.7–4.0)
MCH: 30 pg (ref 26.0–34.0)
MCHC: 32.2 g/dL (ref 30.0–36.0)
MCV: 93.3 fL (ref 80.0–100.0)
Monocytes Absolute: 1 10*3/uL (ref 0.1–1.0)
Monocytes Relative: 9 %
Neutro Abs: 7 10*3/uL (ref 1.7–7.7)
Neutrophils Relative %: 65 %
Platelets: 256 10*3/uL (ref 150–400)
RBC: 4.3 MIL/uL (ref 3.87–5.11)
RDW: 13.7 % (ref 11.5–15.5)
WBC: 10.6 10*3/uL — ABNORMAL HIGH (ref 4.0–10.5)
nRBC: 0 % (ref 0.0–0.2)

## 2023-03-25 LAB — TROPONIN I (HIGH SENSITIVITY): Troponin I (High Sensitivity): 13 ng/L (ref <18)

## 2023-03-25 LAB — COMPREHENSIVE METABOLIC PANEL
ALT: 21 U/L (ref 0–44)
AST: 17 U/L (ref 15–41)
Albumin: 3.4 g/dL — ABNORMAL LOW (ref 3.5–5.0)
Alkaline Phosphatase: 93 U/L (ref 38–126)
Anion gap: 10 (ref 5–15)
BUN: 5 mg/dL — ABNORMAL LOW (ref 6–20)
CO2: 29 mmol/L (ref 22–32)
Calcium: 9.2 mg/dL (ref 8.9–10.3)
Chloride: 102 mmol/L (ref 98–111)
Creatinine, Ser: 0.72 mg/dL (ref 0.44–1.00)
GFR, Estimated: >60 mL/min (ref >60)
Glucose, Bld: 92 mg/dL (ref 70–99)
Potassium: 3.2 mmol/L — ABNORMAL LOW (ref 3.5–5.1)
Sodium: 141 mmol/L (ref 135–145)
Total Bilirubin: 0.8 mg/dL (ref 0.3–1.2)
Total Protein: 6.2 g/dL — ABNORMAL LOW (ref 6.5–8.1)

## 2023-03-25 LAB — LIPASE, BLOOD: Lipase: 23 U/L (ref 11–51)

## 2023-03-25 MED ORDER — ALUM & MAG HYDROXIDE-SIMETH 200-200-20 MG/5ML PO SUSP
30.0000 mL | Freq: Once | ORAL | Status: AC
  Administered 2023-03-25: 30 mL via ORAL
  Filled 2023-03-25: qty 30

## 2023-03-25 NOTE — ED Provider Triage Note (Signed)
Emergency Medicine Provider Triage Evaluation Note  Carol Cummings , a 57 y.o. female  was evaluated in triage.  Pt complains of diffuse abdominal pain for the past few weeks.  Reports some left flank pain for the past few months.  She reports that she has occasional nausea.  Reports chronic constipation.  She reports that today she started to feel the pain going into her more epigastric region and radiating up into her chest some.  Has shortness of breath at baseline because of her lung disease.  No fevers.  No vomiting.  She went to be seen by her primary care doctor today but the power was out.  We made her, to the emerged part today was the radiating of pain into her chest.  Review of Systems  Positive:  Negative:   Physical Exam  BP (!) 146/72 (BP Location: Right Arm)   Pulse 94   Temp 98.5 F (36.9 C)   Resp 20   SpO2 100%  Gen:   Awake, no distress   Resp:  Normal effort  MSK:   Moves extremities without difficulty  Other:  Abdomen is soft.  Diffuse abdominal tenderness palpation.  Wearing her nasal cannula oxygen.  Medical Decision Making  Medically screening exam initiated at 9:09 PM.  Appropriate orders placed.  Ta Fair was informed that the remainder of the evaluation will be completed by another provider, this initial triage assessment does not replace that evaluation, and the importance of remaining in the ED until their evaluation is complete.  CT ordered with labs.    Achille Rich, [place] 03/25/23 2110

## 2023-03-25 NOTE — ED Triage Notes (Signed)
Pt having epigastric pain for weeks. Tried to see MD today but power was off. She states pain in lower back and diffusely over belly and epigastric area. Pt has been taking oxy for it but that not helping. Pt still has gallbladder and appendix. Large intestine removed 2017.

## 2023-03-25 NOTE — ED Notes (Signed)
Pt. Returned to [facility] from xray. Xray tech reported home O2 tank almost empty. New O2 tank replaced at this time.

## 2023-03-25 NOTE — ED Notes (Signed)
New Oxygen tank given.

## 2023-03-26 LAB — TROPONIN I (HIGH SENSITIVITY): Troponin I (High Sensitivity): 12 ng/L (ref <18)

## 2023-03-26 NOTE — ED Notes (Signed)
Called to repeat vitals x3, no answer

## 2023-03-28 NOTE — Plan of Care (Signed)
[facility] Tonsillectomy/Adenoidectomy, Postoperative PEDS care plan entered in error.

## 2023-04-07 ENCOUNTER — Ambulatory Visit: Payer: 59 | Admitting: Oncology

## 2023-04-07 ENCOUNTER — Other Ambulatory Visit: Payer: 59

## 2023-05-26 ENCOUNTER — Other Ambulatory Visit: Payer: Self-pay

## 2023-05-26 MED ORDER — FUROSEMIDE 20 MG PO TABS: ORAL_TABLET | ORAL | 0 refills | Status: AC

## 2023-07-17 ENCOUNTER — Encounter: Payer: Self-pay | Admitting: Oncology

## 2023-08-09 ENCOUNTER — Encounter: Payer: Self-pay | Admitting: Oncology

## 2023-08-10 ENCOUNTER — Other Ambulatory Visit: Payer: Self-pay | Admitting: Cardiology

## 2023-08-13 ENCOUNTER — Other Ambulatory Visit: Payer: Self-pay

## 2023-08-13 DIAGNOSIS — D500 Iron deficiency anemia secondary to blood loss (chronic): Secondary | ICD-10-CM

## 2023-08-15 NOTE — Progress Notes (Unsigned)
 Carol Cummings  [address] [place],  [place]  [address] [phone]  Clinic Day:  02/15/2023  Referring physician: Grayland Jack, *   HISTORY OF PRESENT ILLNESS:  The patient is a 58 y.o. female with iron deficiency anemia, likely related to previously heavy menstrual cycles.  She comes in today to reassess her iron and hemoglobin levels after receiving IV Feraheme in May 2024.  Overall, the patient claims to feel much better since receiving her IV iron.  She denies having any overt forms of blood loss since her last visit.  PHYSICAL EXAM:  There were no vitals taken for this visit. Wt Readings from Last 3 Encounters:  02/15/23 179 lb 9.6 oz (81.5 kg)  10/15/22 179 lb 1.3 oz (81.2 kg)  10/06/22 176 lb 9.6 oz (80.1 kg)   There is no height or weight on file to calculate BMI. Performance status (ECOG): 0 - Asymptomatic Physical Exam Constitutional:      Appearance: Normal appearance. She is not ill-appearing.  HENT:     Mouth/Throat:     Mouth: Mucous membranes are moist.     Pharynx: Oropharynx is clear. No oropharyngeal exudate or posterior oropharyngeal erythema.  Cardiovascular:     Rate and Rhythm: Normal rate and regular rhythm.     Heart sounds: No murmur heard.    No friction rub. No gallop.  Pulmonary:     Effort: Pulmonary effort is normal. No respiratory distress.     Breath sounds: Normal breath sounds. No wheezing, rhonchi or rales.  Abdominal:     General: Bowel sounds are normal. There is no distension.     Palpations: Abdomen is soft. There is no mass.     Tenderness: There is no abdominal tenderness.  Musculoskeletal:        General: No swelling.     Right lower leg: No edema.     Left lower leg: No edema.  Lymphadenopathy:     Cervical: No cervical adenopathy.     Upper Body:     Right upper body: No supraclavicular or axillary adenopathy.     Left upper body: No supraclavicular or axillary adenopathy.      Lower Body: No right inguinal adenopathy. No left inguinal adenopathy.  Skin:    General: Skin is warm.     Coloration: Skin is not jaundiced.     Findings: No lesion or rash.  Neurological:     General: No focal deficit present.     Mental Status: She is alert and oriented to person, place, and time. Mental status is at baseline.  Psychiatric:        Mood and Affect: Mood normal.        Behavior: Behavior normal.        Thought Content: Thought content normal.    LABS:      Latest Ref Rng & Units 03/25/2023    9:11 PM 02/15/2023   12:00 AM 10/06/2022   12:00 AM  CBC  WBC 4.0 - 10.5 K/uL 10.6  21.1     9.3      Hemoglobin 12.0 - 15.0 g/dL 29.5  62.1     30.8      Hematocrit 36.0 - 46.0 % 40.1  45     38      Platelets 150 - 400 K/uL 256  285     386         This result is from an external source.  Latest Ref Rng & Units 03/25/2023    9:11 PM 04/22/2022    4:08 PM 09/19/2020    8:29 AM  CMP  Glucose 70 - 99 mg/dL 92  161  096   BUN 6 - 20 mg/dL 5  14  10    Creatinine 0.44 - 1.00 mg/dL 0.45  4.09  8.11   Sodium 135 - 145 mmol/L 141  142  139   Potassium 3.5 - 5.1 mmol/L 3.2  4.4  4.5   Chloride 98 - 111 mmol/L 102  97  104   CO2 22 - 32 mmol/L 29  29  27    Calcium 8.9 - 10.3 mg/dL 9.2  91.4  9.7   Total Protein 6.5 - 8.1 g/dL 6.2     Total Bilirubin 0.3 - 1.2 mg/dL 0.8     Alkaline Phos 38 - 126 U/L 93     AST 15 - 41 U/L 17     ALT 0 - 44 U/L 21       Latest Reference Range & Units 10/06/22 10:26 02/15/23 13:52  Iron 28 - 170 ug/dL 43 62  UIBC ug/dL 782 956  TIBC 213 - 086 ug/dL 578 (H) 469  Saturation Ratios 10.4 - 31.8 % 9 (L) 19  Ferritin 11 - 307 ng/mL 13 244  (H): Data is abnormally high (L): Data is abnormally low   ASSESSMENT & PLAN:  Assessment/Plan:  A 58 y.o. female with iron deficiency anemia.  I am very pleased with the improvement in both her iron and hemoglobin levels since she received another course of IV iron in May 2024.  Clinically, the  patient is doing much better.  As that is the case, I will see her back in 6 months for repeat clinical assessment.  The patient understands all the plans discussed today and is in agreement with them.    Tyashia Morrisette Kirby Funk, MD

## 2023-08-16 ENCOUNTER — Inpatient Hospital Stay (HOSPITAL_BASED_OUTPATIENT_CLINIC_OR_DEPARTMENT_OTHER): Payer: 59 | Admitting: Oncology

## 2023-08-16 ENCOUNTER — Other Ambulatory Visit: Payer: Self-pay | Admitting: Oncology

## 2023-08-16 ENCOUNTER — Inpatient Hospital Stay: Payer: 59 | Attending: Oncology

## 2023-08-16 ENCOUNTER — Telehealth: Payer: Self-pay | Admitting: Oncology

## 2023-08-16 VITALS — BP 120/53 | HR 95 | Temp 99.1°F | Resp 18 | Ht 65.0 in | Wt 155.0 lb

## 2023-08-16 DIAGNOSIS — D500 Iron deficiency anemia secondary to blood loss (chronic): Secondary | ICD-10-CM | POA: Diagnosis not present

## 2023-08-16 DIAGNOSIS — D509 Iron deficiency anemia, unspecified: Secondary | ICD-10-CM | POA: Diagnosis present

## 2023-08-16 DIAGNOSIS — N920 Excessive and frequent menstruation with regular cycle: Secondary | ICD-10-CM | POA: Diagnosis present

## 2023-08-16 DIAGNOSIS — K769 Liver disease, unspecified: Secondary | ICD-10-CM | POA: Diagnosis not present

## 2023-08-16 LAB — CBC WITH DIFFERENTIAL (CANCER CENTER ONLY)
Abs Immature Granulocytes: 0.14 10*3/uL — ABNORMAL HIGH (ref 0.00–0.07)
Basophils Absolute: 0.1 10*3/uL (ref 0.0–0.1)
Basophils Relative: 1 %
Eosinophils Absolute: 0.1 10*3/uL (ref 0.0–0.5)
Eosinophils Relative: 1 %
HCT: 37 % (ref 36.0–46.0)
Hemoglobin: 12.1 g/dL (ref 12.0–15.0)
Immature Granulocytes: 1 %
Lymphocytes Relative: 19 %
Lymphs Abs: 2.9 10*3/uL (ref 0.7–4.0)
MCH: 29.9 pg (ref 26.0–34.0)
MCHC: 32.7 g/dL (ref 30.0–36.0)
MCV: 91.4 fL (ref 80.0–100.0)
Monocytes Absolute: 1.4 10*3/uL — ABNORMAL HIGH (ref 0.1–1.0)
Monocytes Relative: 9 %
Neutro Abs: 10.6 10*3/uL — ABNORMAL HIGH (ref 1.7–7.7)
Neutrophils Relative %: 69 %
Platelet Count: 388 10*3/uL (ref 150–400)
RBC: 4.05 MIL/uL (ref 3.87–5.11)
RDW: 14 % (ref 11.5–15.5)
WBC Count: 15.2 10*3/uL — ABNORMAL HIGH (ref 4.0–10.5)
nRBC: 0 % (ref 0.0–0.2)
nRBC: 0 /100{WBCs}

## 2023-08-16 LAB — IRON AND TIBC
Iron: 69 ug/dL (ref 28–170)
Saturation Ratios: 20 % (ref 10.4–31.8)
TIBC: 339 ug/dL (ref 250–450)
UIBC: 270 ug/dL

## 2023-08-16 LAB — FERRITIN: Ferritin: 59 ng/mL (ref 11–307)

## 2023-08-16 NOTE — Telephone Encounter (Signed)
 Patient has been scheduled for follow-up visit per 08/16/23 LOS.  Pt given an appt calendar with date and time.

## 2023-08-27 ENCOUNTER — Telehealth: Payer: Self-pay | Admitting: Cardiology

## 2023-08-27 MED ORDER — METOPROLOL SUCCINATE ER 25 MG PO TB24: ORAL_TABLET | ORAL | 0 refills | Status: DC

## 2023-08-27 NOTE — Telephone Encounter (Signed)
*  STAT* If patient is at the pharmacy, call can be transferred to refill team.   1. Which medications need to be refilled? (please list name of each medication and dose if known) metoprolol succinate (TOPROL-XL) 25 MG 24 hr tablet   2. Which pharmacy/location (including street and city if local pharmacy) is medication to be sent to? [facility] [phone] - [place], [place] - [address]   3. Do they need a 30 day or 90 day supply? 90

## 2023-08-27 NOTE — Telephone Encounter (Signed)
 Pt's medication was sent to pt's pharmacy as requested. Confirmation received.

## 2023-09-02 NOTE — Progress Notes (Signed)
 Cardiology Clinic Note   Patient Name: Carol Cummings Date of Encounter: 09/06/2023  Primary Care Provider:  [facility] Primary Cardiologist:  Thomasene Ripple, DO  Patient Profile    Carol Cummings 58 year old female presents to the clinic today for follow-up evaluation of her coronary artery disease and hypertension.  Past Medical History    Past Medical History:  Diagnosis Date   Abnormal PFT 04/26/2013   Anxiety 11/28/2012   Anxiety and depression 07/30/2021   Atrial fibrillation with RVR (HCC) 07/30/2021   Bilateral leg edema 07/26/2019   Bronchopneumonia 07/30/2021   Chest tightness or pressure 11/28/2012   Chronic constipation 11/28/2012   Chronic obstructive pulmonary disease, unspecified (HCC) 07/12/2007   Qualifier: Diagnosis of  By: Sherene Sires MD, Alinda Sierras of this note might be different from the original. Overview:  Qualifier: Diagnosis of  By: Sherene Sires MD, Michael B   Chronic respiratory failure (HCC) 11/28/2012   Coronary artery disease involving native coronary artery of native heart 09/08/2019   EKG abnormalities 07/30/2021   Emphysema lung (HCC)    Essential hypertension 09/08/2019   Family history of early CAD 11/28/2012   Hyperlipidemia 08/31/2017   Hypertension    Iron deficiency anemia due to chronic blood loss 09/25/2020   O2 dependent 08/31/2017   2-6 L   Obesity (BMI 30-39.9) 09/06/2020   Obesity, unspecified 07/12/2007   Qualifier: Diagnosis of  By: Sherene Sires MD, Alinda Sierras of this note might be different from the original. Overview:  Qualifier: Diagnosis of  By: Sherene Sires MD, Casimiro Needle B   OSA (obstructive sleep apnea) 07/30/2021   Overweight 11/28/2012   Oxygen dependent    2-6 L   Palpitations 08/31/2017   Shortness of breath 07/26/2019   Sinus bradycardia 07/26/2019   Sinus tachycardia 08/31/2017   Sore throat 11/28/2012   Past Surgical History:  Procedure Laterality Date   BLADDER SUSPENSION     large intestine  surgery     LUNG LOBECTOMY     both upper lobes   NASAL SEPTUM SURGERY     TUBAL LIGATION      Allergies  Allergies  Allergen Reactions   Topiramate Palpitations    Headache, Angioedema   Phentermine Palpitations    Felt like she was having a heart attack     History of Present Illness    Carol Cummings has a PMH of coronary artery disease, paroxysmal atrial fibrillation, hyperlipidemia, hypertension, OSA, and abnormal PFTs.  Coronary CTA showed small distal area of the LAD with flow-limiting lesion and no other flow-limiting lesions.  Her PMH also includes COPD and she is on 4 L of oxygen.  She was seen in follow-up by Dr. Servando Salina 5/23.  She was doing well at that time.  She had been seen by Dr. Tomie China.  She was diagnosed with atrial fibrillation and placed on a cardiac event monitor.  Her cardiac event monitor did not show any atrial fibrillation.  She was seen in follow-up by Dr. Servando Salina 04/22/2022.  She had no complaints at that time.  She presents to the clinic today for follow-up evaluation and states she was recently at [facility] and told that she had moderate coronary artery calcifications.  We reviewed this test and also reviewed her previous coronary CTA.  She expressed understanding.  She denies episodes of chest discomfort.  She is on 2 to 3 L nasal cannula and remains stable.  She will follow-up  with oncology tomorrow and her PCP.  I have asked her to increase the fiber in her diet.  Will plan follow-up with cardiology in 6 months.  Today's she denies chest pain, increased shortness of breath, lower extremity edema, fatigue, palpitations, melena, hematuria, hemoptysis, diaphoresis, weakness, presyncope, syncope, orthopnea, and PND.    Home Medications    Prior to Admission medications   Medication Sig Start Date End Date Taking? Authorizing Provider  albuterol (PROVENTIL) (2.5 MG/3ML) 0.083% nebulizer solution Take 2.5 mg by nebulization 4 (four) times daily. 07/18/19    [provider]  albuterol (VENTOLIN HFA) 108 (90 Base) MCG/ACT inhaler Inhale 1 puff into the lungs 3 (three) times daily as needed for wheezing or shortness of breath. 03/01/13   [provider]  ALPRAZolam Prudy Feeler) 1 MG tablet Take 1 mg by mouth 2 (two) times daily. 07/13/17   [provider]  aspirin EC 81 MG tablet Take 1 tablet (81 mg total) by mouth daily. 08/25/19   Tobb, Kardie, DO  azelastine (ASTELIN) 0.1 % nasal spray Place 1 spray into both nostrils 2 (two) times daily. 09/05/13   [provider]  buPROPion (WELLBUTRIN SR) 150 MG 12 hr tablet Take 150 mg by mouth 2 (two) times daily. 04/22/15   [provider]  diclofenac (VOLTAREN) 50 MG EC tablet Take 50 mg by mouth 2 (two) times daily as needed. 06/23/23   [provider]  dicyclomine (BENTYL) 10 MG capsule Take 10 mg by mouth every 6 (six) hours as needed. 04/12/23   [provider]  doxycycline (VIBRA-TABS) 100 MG tablet Take 100 mg by mouth 2 (two) times daily. 04/27/23   [provider]  EPINEPHrine 0.3 mg/0.3 mL IJ SOAJ injection  09/15/21   [provider]  estradiol (ESTRACE) 0.1 MG/GM vaginal cream Place a fingertip amount of cream in the vagina nightly x 2 weeks, then use every other night 12/15/22   [provider]  Fluticasone-Umeclidin-Vilant (TRELEGY ELLIPTA) 200-62.5-25 MCG/INH AEPB Inhale 25 mcg into the lungs daily. 10/30/19   [provider]  furosemide (LASIX) 20 MG tablet Take 1 tablet by mouth on Tuesdays and Saturdays 05/26/23   Tobb, Kardie, DO  gabapentin (NEURONTIN) 100 MG capsule Take 100 mg by mouth daily. 07/02/23   [provider]  guaiFENesin (MUCINEX) 600 MG 12 hr tablet Take 600 mg by mouth as needed for cough or to loosen phlegm. For cough    [provider]  ibuprofen (ADVIL) 200 MG tablet Take 200 mg by mouth every 6 (six) hours as needed for mild pain.    [provider]   ipratropium-albuterol (DUONEB) 0.5-2.5 (3) MG/3ML SOLN Inhale 3 mLs into the lungs 4 (four) times daily. 07/07/10   [provider]  ketorolac (TORADOL) 10 MG tablet Take 10 mg by mouth 2 (two) times daily. 04/19/23   [provider]  LINZESS 290 MCG CAPS capsule TAKE 1 CAPSULE(290 MCG) BY MOUTH DAILY 07/12/23   [provider]  loratadine (CLARITIN) 10 MG tablet Take 10 mg by mouth daily. 08/04/21   [provider]  methocarbamol (ROBAXIN) 500 MG tablet Take 500 mg by mouth 2 (two) times daily as needed. 06/28/23   [provider]  metoprolol succinate (TOPROL-XL) 25 MG 24 hr tablet Take 25mg  in the am (1 tablet) and 12.5mg  in the evening (0.5 tablet) 08/27/23   Tobb, Kardie, DO  metoprolol tartrate (LOPRESSOR) 25 MG tablet Take by mouth. 05/24/15   [provider]  naloxone (NARCAN) nasal spray 4 mg/0.1 mL Place into the nose. 11/04/17   [provider]  nystatin (MYCOSTATIN) 100000 UNIT/ML suspension SHAKE LIQUID WELL. SWISH THEN SWALLOW 4 ML FOUR TIMES DAILY FOR 1 WEEK 05/20/23   [provider]  OHTUVAYRE 3 MG/2.5ML SUSP  08/15/23   [provider]  ondansetron (ZOFRAN-ODT) 4 MG disintegrating tablet Take 4 mg by mouth every 6 (six) hours as needed. 06/03/23   [provider]  oxyCODONE (OXY IR/ROXICODONE) 5 MG immediate release tablet Take 5-10 mg by mouth every 4 (four) hours as needed. 06/08/23   [provider]  Oxycodone HCl 10 MG TABS Take 10 mg by mouth 5 (five) times daily.    [provider]  oxyCODONE-acetaminophen (PERCOCET/ROXICET) 5-325 MG tablet Take 1 tablet by mouth every 4 (four) hours as needed. 06/03/23   [provider]  pantoprazole (PROTONIX) 40 MG tablet Take 40 mg by mouth daily. 12/21/14   [provider]  potassium chloride (KLOR-CON) 20 MEQ packet Take 20 mEq by mouth daily. 05/19/23   [provider]  predniSONE (DELTASONE) 10 MG tablet Take  by mouth. 08/01/22   [provider]  rosuvastatin (CRESTOR) 20 MG tablet Take 20 mg by mouth at bedtime. 03/05/21   [provider]  traMADol (ULTRAM) 50 MG tablet SMARTSIG:50 Milligram(s) By Mouth Every 4-6 Hours PRN 02/25/23   [provider]  valACYclovir (VALTREX) 500 MG tablet Take 500 mg by mouth 2 (two) times daily. 08/03/23   [provider]    Family History    Family History  Problem Relation Age of Onset   Stomach cancer Mother    Hyperlipidemia Mother    Hypertension Mother    Diabetes Mother    Stroke Mother    Emphysema Father    Heart attack Father    Aneurysm Sister    Emphysema Brother    She indicated that her mother is deceased. She indicated that her father is deceased. She indicated that her sister is deceased. She indicated that her brother is deceased.  Social History    Social History   Socioeconomic History   Marital status: Married    Spouse name: Not on file   Number of children: Not on file   Years of education: Not on file   Highest education level: Not on file  Occupational History   Not on file  Tobacco Use   Smoking status: Former   Smokeless tobacco: Never  Vaping Use   Vaping status: Never Used  Substance and Sexual Activity   Alcohol use: Not Currently   Drug use: Never   Sexual activity: Not on file  Other Topics Concern   Not on file  Social History Narrative   Not on file   Social Drivers of Health   Financial Resource Strain: Not on file  Food Insecurity: Not on file  Transportation Needs: Not on file  Physical Activity: Not on file  Stress: Not on file  Social Connections: Not on file  Intimate Partner Violence: Not on file     Review of Systems    General:  No chills, fever, night sweats or weight changes.  Cardiovascular:  No chest pain, dyspnea on exertion, edema, orthopnea, palpitations, paroxysmal nocturnal dyspnea. Dermatological: No rash, lesions/masses Respiratory: No  cough, dyspnea Urologic: No hematuria, dysuria Abdominal:   No nausea, vomiting, diarrhea, bright red blood per rectum, melena, or hematemesis Neurologic:  No visual changes, wkns, changes in mental status. All other  systems reviewed and are otherwise negative except as noted above.  Physical Exam    VS:  BP 108/74   Pulse 93   Ht 5\' 5"  (1.651 m)   Wt 159 lb 12.8 oz (72.5 kg)   SpO2 98%   BMI 26.59 kg/m  , BMI Body mass index is 26.59 kg/m. GEN: Well nourished, well developed, in no acute distress. HEENT: normal. Neck: Supple, no JVD, carotid bruits, or masses. Cardiac: RRR, no murmurs, rubs, or gallops. No clubbing, cyanosis, edema.  Radials/DP/PT 2+ and equal bilaterally.  Respiratory:  Respirations regular and unlabored, clear to auscultation bilaterally. GI: Soft, nontender, nondistended, BS + x 4. MS: no deformity or atrophy. Skin: warm and dry, no rash. Neuro:  Strength and sensation are intact. Psych: Normal affect.  Accessory Clinical Findings    Recent Labs: 03/25/2023: ALT 21; BUN 5; Creatinine, Ser 0.72; Potassium 3.2; Sodium 141 08/16/2023: Hemoglobin 12.1; Platelet Count 388   Recent Lipid Panel    Component Value Date/Time   CHOL 170 04/25/2019 1013   TRIG 142 04/25/2019 1013   HDL 54 04/25/2019 1013   CHOLHDL 3.1 04/25/2019 1013   LDLCALC 91 04/25/2019 1013         ECG personally reviewed by me today-none today.    Echocardiogram 06/19/2019  IMPRESSIONS     1. Left ventricular ejection fraction, by visual estimation, is 55 to  60%. The left ventricle has normal function. There is no left ventricular  hypertrophy.   2. Left ventricular diastolic parameters are consistent with Grade I  diastolic dysfunction (impaired relaxation).   3. The left ventricle has no regional wall motion abnormalities.   4. The mitral valve is normal in structure. No evidence of mitral valve  regurgitation. No evidence of mitral stenosis.   5. The tricuspid valve is  normal in structure.     Left Ventricle: Left ventricular ejection fraction, by visual estimation,  is 55 to 60%. The left ventricle has normal function. The left ventricle  has no regional wall motion abnormalities. There is no left ventricular  hypertrophy. Left ventricular  diastolic parameters are consistent with Grade I diastolic dysfunction  (impaired relaxation). Normal left atrial pressure.   Right Ventricle: The right ventricular size is normal. No increase in  right ventricular wall thickness. Global RV systolic function is has  normal systolic function. The tricuspid regurgitant velocity is 2.27 m/s,  and with an assumed right atrial pressure   of 8 mmHg, the estimated right ventricular systolic pressure is normal at  28.6 mmHg.   Left Atrium: Left atrial size was normal in size.   Right Atrium: Right atrial size was normal in size   Pericardium: There is no evidence of pericardial effusion.   Mitral Valve: The mitral valve is normal in structure. No evidence of  mitral valve regurgitation. No evidence of mitral valve stenosis by  observation.   Tricuspid Valve: The tricuspid valve is normal in structure. Tricuspid  valve regurgitation is mild.   Aortic Valve: The aortic valve is normal in structure. Aortic valve  regurgitation is not visualized. The aortic valve is structurally normal,  with no evidence of sclerosis or stenosis.   Pulmonic Valve: The pulmonic valve was normal in structure. Pulmonic valve  regurgitation is not visualized. Pulmonic regurgitation is not visualized.   Aorta: The aortic root, ascending aorta and aortic arch are all  structurally normal, with no evidence of dilitation or obstruction.   Venous: The inferior vena cava is normal in  size with greater than 50%  respiratory variability, suggesting right atrial pressure of 3 mmHg.   IAS/Shunts: No atrial level shunt detected by color flow Doppler. There is  no evidence of a patent foramen  ovale. No ventricular septal defect is  seen or detected. There is no evidence of an atrial septal defect.   Coronary CTA 08/23/2019   FINDINGS: FFRct analysis was performed on the original cardiac CT angiogram dataset. Diagrammatic representation of the FFRct analysis is provided in a separate PDF document in PACS. This dictation was created using the PDF document and an interactive 3D model of the results. 3D model is not available in the EMR/PACS. Normal FFR range is >0.80.   1. Left Main: 0.96   2. LAD: 0.92, 0.83, 0.78 (at the distal end)   3. LCX:0.90, 0.86   4. RCA: 0.89   IMPRESSION: FFR ct significant stenosis (0.78) at the distal end of the LAD as it tapers off. This is a small area. Recommend aggressive medical therapy before attempt for revascularization. No other evidence of significant stenosis or flow limiting lesion.   Note: These examples are not recommendations of [employer] and only provided as examples of what other customers are doing.     Electronically Signed   By: Thomasene Ripple MD   On: 08/24/2019 20:59      Assessment & Plan   1.  Coronary artery disease-denies anginal type symptoms.  Denies exertional chest pain.  Noted to have small distal area of flow-limiting dissection within her LAD.  No other flow-limiting lesions were noted. Heart healthy low-sodium diet Maintain physical activity Continue aspirin, metoprolol, rosuvastatin  Hyperlipidemia-LDL 91 . High-fiber diet Continue aspirin, rosuvastatin Repeat fasting lipids and LFTs-lab slips given  Essential hypertension-BP today 108/74. Maintain blood pressure log Continue metoprolol  OSA-reports compliance with CPAP.  Waking up well rested. Maintain CPAP use  COPD, chronic respiratory failure-breathing at baseline.  Continues with 2-3 L nasal cannula. Follows with pulmonology  Disposition: Follow-up with Dr. Servando Salina or me in 6 months.   Thomasene Ripple. Sharelle Burditt NP-C     09/06/2023, 3:17  PM [facility] [address] Office [phone] Fax [phone]    I spent 14 minutes examining this patient, reviewing medications, and using patient centered shared decision making involving their cardiac care.   I spent  20 minutes reviewing past medical history,  medications, and prior cardiac tests.

## 2023-09-06 ENCOUNTER — Ambulatory Visit: Attending: General Practice | Admitting: General Practice

## 2023-09-06 ENCOUNTER — Encounter: Payer: Self-pay | Admitting: General Practice

## 2023-09-06 VITALS — BP 108/74 | HR 93 | Ht 65.0 in | Wt 159.8 lb

## 2023-09-06 DIAGNOSIS — E782 Mixed hyperlipidemia: Secondary | ICD-10-CM | POA: Diagnosis not present

## 2023-09-06 DIAGNOSIS — G4733 Obstructive sleep apnea (adult) (pediatric): Secondary | ICD-10-CM | POA: Diagnosis not present

## 2023-09-06 DIAGNOSIS — J9611 Chronic respiratory failure with hypoxia: Secondary | ICD-10-CM

## 2023-09-06 DIAGNOSIS — I10 Essential (primary) hypertension: Secondary | ICD-10-CM

## 2023-09-06 DIAGNOSIS — I2510 Atherosclerotic heart disease of native coronary artery without angina pectoris: Secondary | ICD-10-CM

## 2023-09-06 NOTE — Patient Instructions (Addendum)
 Medication Instructions:  The current medical regimen is effective;  continue present plan and medications as directed. Please refer to the Current Medication list given to you today.  *If you need a refill on your cardiac medications before your next appointment, please call your pharmacy*  Lab Work: FASTING LIPID AND LFT AT YOUR PRIMARY MD If you have labs (blood work) drawn today and your tests are completely normal, you will receive your results only by:  MyChart Message (if you have MyChart) OR A paper copy in the mail If you have any lab test that is abnormal or we need to change your treatment, we will call you to review the results.  Testing/Procedures: NONE  Follow-Up: At [facility], you and your health needs are our priority.  As part of our continuing mission to provide you with exceptional heart care, our providers are all part of one team.  This team includes your primary Cardiologist (physician) and Advanced Practice Providers or APPs (Physician Assistants and Nurse Practitioners) who all work together to provide you with the care you need, when you need it.  Your next appointment:   6 month(s)  Provider:   Thomasene Ripple, DO or Edd Fabian, NP          Other Instructions FOLLOW ATTACHED FIBER DIET      High-Fiber Eating Plan Fiber, also called dietary fiber, is found in foods such as fruits, vegetables, whole grains, and beans. A high-fiber diet can be good for your health. Your health care provider may recommend a high-fiber diet to help: Prevent trouble pooping (constipation). Lower your cholesterol. Treat the following conditions: Hemorrhoids. This is inflammation of veins in the anus. Inflammation of specific areas of the digestive tract. Irritable bowel syndrome (IBS). This is a problem of the large intestine, also called the colon, that sometimes causes belly pain and bloating. Prevent overeating as part of a weight-loss plan. Lower the risk of heart  disease, type 2 diabetes, and certain cancers. What are tips for following this plan? Reading food labels  Check the nutrition facts label on foods for the amount of dietary fiber. Choose foods that have 4 grams of fiber or more per serving. The recommended goals for how much fiber you should eat each day include: Males 70 years old or younger: 30-34 g. Males over 71 years old: 28-34 g. Females 75 years old or younger: 25-28 g. Females over 66 years old: 22-25 g. Shopping Choose whole fruits and vegetables instead of processed. For example, choose apples instead of apple juice or applesauce. Choose a variety of high-fiber foods such as avocados, lentils, oats, and pinto beans. Read the nutrition facts label on foods. Check for foods with added fiber. These foods often have high sugar and salt (sodium) amounts per serving. Cooking Use whole-grain flour for baking and cooking. Cook with brown rice instead of white rice. Make meals that have a lot of beans and vegetables in them, such as chili or vegetable-based soups. Meal planning Start the day with a breakfast that is high in fiber, such as a cereal that has 5 g of fiber or more per serving. Eat breads and cereals that are made with whole-grain flour instead of refined flour or white flour. Eat brown rice, bulgur wheat, or millet instead of white rice. Use beans in place of meat in soups, salads, and pasta dishes. Be sure that half of the grains you eat each day are whole grains. General information You can get the recommended  amount of dietary fiber by: Eating a variety of fruits, vegetables, grains, nuts, and beans. Taking a fiber supplement if you aren't able to eat enough fiber. It's better to get fiber through food than from a supplement. Slowly increase how much fiber you eat. If you increase the amount of fiber you eat too quickly, you may have bloating, cramping, or gas. Drink plenty of water to help you digest fiber. Choose  high-fiber snacks, such as berries, raw vegetables, nuts, and popcorn. What foods should I eat? Fruits Berries. Pears. Apples. Oranges. Avocado. Prunes and raisins. Dried figs. Vegetables Sweet potatoes. Spinach. Kale. Artichokes. Cabbage. Broccoli. Cauliflower. Green peas. Carrots. Squash. Grains Whole-grain breads. Multigrain cereal. Oats and oatmeal. Brown rice. Barley. Bulgur wheat. Millet. Quinoa. Bran muffins. Popcorn. Rye wafer crackers. Meats and other proteins Navy beans, kidney beans, and pinto beans. Soybeans. Split peas. Lentils. Nuts and seeds. Dairy Fiber-fortified yogurt. Fortified means that fiber has been added to the product. Beverages Fiber-fortified soy milk. Fiber-fortified orange juice. Other foods Fiber bars. The items listed above may not be all the foods and drinks you can have. Talk to a dietitian to learn more. What foods should I avoid? Fruits Fruit juice. Cooked, strained fruit. Vegetables Fried potatoes. Canned vegetables. Well-cooked vegetables. Grains White bread. Pasta made with refined flour. White rice. Meats and other proteins Fatty meat. Fried chicken or fried fish. Dairy Milk. Cream cheese. Sour cream. Fats and oils Butters. Beverages Soft drinks. Other foods Cakes and pastries. The items listed above may not be all the foods and drinks you should avoid. Talk to a dietitian to learn more. This information is not intended to replace advice given to you by your health care provider. Make sure you discuss any questions you have with your health care provider.    1st Floor: - Lobby - Registration  - Pharmacy  - Lab - Cafe  2nd Floor: - PV Lab - Diagnostic Testing (echo, CT, nuclear med)  3rd Floor: - Vacant  4th Floor: - TCTS (cardiothoracic surgery) - AFib Clinic - Structural Heart Clinic - Vascular Surgery  - Vascular Ultrasound  5th Floor: - HeartCare Cardiology (general and EP) - Clinical Pharmacy for coumadin,  hypertension, lipid, weight-loss medications, and med management appointments    Valet parking services will be available as well.

## 2023-09-08 ENCOUNTER — Other Ambulatory Visit: Payer: Self-pay | Admitting: Cardiology

## 2023-09-08 ENCOUNTER — Telehealth: Payer: Self-pay

## 2023-09-09 ENCOUNTER — Telehealth: Payer: Self-pay | Admitting: Cardiology

## 2023-09-09 MED ORDER — METOPROLOL SUCCINATE ER 25 MG PO TB24: ORAL_TABLET | ORAL | 3 refills | Status: DC

## 2023-09-09 NOTE — Telephone Encounter (Signed)
*  STAT* If patient is at the pharmacy, call can be transferred to refill team.   1. Which medications need to be refilled? (please list name of each medication and dose if known)   metoprolol succinate (TOPROL-XL) 25 MG 24 hr tablet     2. Would you like to learn more about the convenience, safety, & potential cost savings by using the [facility]? No   3. Are you open to using the [facility] (Type [facility].) No   4. Which pharmacy/location (including street and city if local pharmacy) is medication to be sent to?  [facility] [phone] - [place], [place] - [address] AT [address]     5. Do they need a 30 day or 90 day supply? 90 day  Pt was seen on 3/31

## 2023-11-20 ENCOUNTER — Other Ambulatory Visit: Payer: Self-pay | Admitting: Cardiology

## 2023-11-25 ENCOUNTER — Other Ambulatory Visit: Payer: Self-pay

## 2023-11-25 MED ORDER — METOPROLOL SUCCINATE ER 25 MG PO TB24: ORAL_TABLET | ORAL | 2 refills | Status: AC

## 2023-12-15 ENCOUNTER — Other Ambulatory Visit: Payer: Self-pay | Admitting: Oncology

## 2023-12-15 DIAGNOSIS — C8338 Diffuse large B-cell lymphoma, lymph nodes of multiple sites: Secondary | ICD-10-CM

## 2023-12-15 NOTE — Progress Notes (Deleted)
 [facility]  [address] [place],  [place]  [address] [phone]  Clinic Day:  08/16/2023  Referring physician: [facility]*   HISTORY OF PRESENT ILLNESS:  The patient is a 58 y.o. 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  She has completed 5 cycles of R-CHOP chemotherapy at [facility].  A PET scan after 2 cycles showed an essentially complete response to therapy.    Of note, I have followed this patient in the past for iron deficiency anemia.    PHYSICAL EXAM:  There were no vitals taken for this visit. Wt Readings from Last 3 Encounters:  09/06/23 159 lb 12.8 oz (72.5 kg)  08/16/23 155 lb (70.3 kg)  02/15/23 179 lb 9.6 oz (81.5 kg)   There is no height or weight on file to calculate BMI. Performance status (ECOG): 0 - Asymptomatic Physical Exam Constitutional:      Appearance: Normal appearance. She is not ill-appearing.  HENT:     Mouth/Throat:     Mouth: Mucous membranes are moist.     Pharynx: Oropharynx is clear. No oropharyngeal exudate or posterior oropharyngeal erythema.  Cardiovascular:     Rate and Rhythm: Normal rate and regular rhythm.     Heart sounds: No murmur heard.    No friction rub. No gallop.  Pulmonary:     Effort: Pulmonary effort is normal. No respiratory distress.     Breath sounds: Normal breath sounds. No wheezing, rhonchi or rales.  Abdominal:     General: Bowel sounds are normal. There is no distension.     Palpations: Abdomen is soft. There is no mass.     Tenderness: There is no abdominal tenderness.  Musculoskeletal:        General: No swelling.     Right lower leg: No edema.     Left lower leg: No edema.  Lymphadenopathy:     Cervical: No cervical adenopathy.     Upper Body:     Right upper body: No supraclavicular or axillary adenopathy.     Left upper body: No supraclavicular or axillary adenopathy.     Lower Body: No right inguinal adenopathy. No left inguinal  adenopathy.  Skin:    General: Skin is warm.     Coloration: Skin is not jaundiced.     Findings: No lesion or rash.  Neurological:     General: No focal deficit present.     Mental Status: She is alert and oriented to person, place, and time. Mental status is at baseline.  Psychiatric:        Mood and Affect: Mood normal.        Behavior: Behavior normal.        Thought Content: Thought content normal.   SCANS:  Her PET scan from 07-28-23 at [facility] revealed the following: IMPRESSION:  1. Multifocal hypermetabolic liver lesions, concerning for metastatic  disease but poorly delineated in the absence of IV contrast. Consider  contrast-enhanced abdominal MRI and/or tissue sampling for further  evaluation.  2. Multifocal hypermetabolic left pleural nodularity/thickening,  concerning for additional sites of malignancy.  3.  Multifocal hypermetabolic groundglass opacities in the bilateral lungs,  concerning for multifocal malignancy given background severe emphysema,  evolution from remote prior exams, and the findings outlined in impressions  #1&2.   PATHOLOGY:  A left abdominal wall mass biopsy in March 2025 revealed the following: High-grade B-cell lymphoma. See comment.   Comment: The lymphoma cells have >  80% proliferation fraction and relatively uniform morphology. The lack of CD10 and Myc staining rules out Burkitt lymphoma and CD34/TdT negativity ruled out a precursor B-cell neoplasm.  The immunophenotype is consistent with a GCB like lymphoma but a double expressor phenotype or evidence of EBV are not seen.    There is a diffuse infiltrate of small uniform round cells with fine chromatin and high nuclear cytoplasmic ratio.  Crush artifact is seen. The following immunohistochemistry was performed after review of the clinical history and morphology to further characterize the pathologic process. The results are as follows:   A1-13    CD3                   Highlights  scattered background T-cells  A1-14    CD20                 Diffusely positive A1-15    KI-67                  Highlights increased proliferation index (80-90%)               A1-16    CD5                   Highlights scattered background T-cells A1-17    CD10                 negative  A1-18    BCL-2                diffusely positive A1-19    BCL-6                diffusely positive A1-20Cyclin D1negative A1-21CMYC IHChighlights less than 5% of cells A1-22    EBER                 negative A1-25    MUM-1               highlights 30% of cells A1-28    CD34                 Negative A1-29    TdT                    Negatove  LABS:      Latest Ref Rng & Units 08/16/2023    9:28 AM 03/25/2023    9:11 PM 02/15/2023   12:00 AM  CBC  WBC 4.0 - 10.5 K/uL 15.2  10.6  21.1      Hemoglobin 12.0 - 15.0 g/dL 87.8  87.0  84.7      Hematocrit 36.0 - 46.0 % 37.0  40.1  45      Platelets 150 - 400 K/uL 388  256  285         This result is from an external source.      Latest Ref Rng & Units 03/25/2023    9:11 PM 04/22/2022    4:08 PM 09/19/2020    8:29 AM  CMP  Glucose 70 - 99 mg/dL 92  885  826   BUN 6 - 20 mg/dL 5  14  10    Creatinine 0.44 - 1.00 mg/dL 9.27  9.03  9.24   Sodium 135 - 145 mmol/L 141  142  139   Potassium 3.5 - 5.1 mmol/L 3.2  4.4  4.5   Chloride 98 - 111 mmol/L 102  97  104   CO2 22 - 32 mmol/L 29  29  27   Calcium  8.9 - 10.3 mg/dL 9.2  89.8  9.7   Total Protein 6.5 - 8.1 g/dL 6.2     Total Bilirubin 0.3 - 1.2 mg/dL 0.8     Alkaline Phos 38 - 126 U/L 93     AST 15 - 41 U/L 17     ALT 0 - 44 U/L 21       Latest Reference Range & Units 08/16/23 09:28  Iron 28 - 170 ug/dL 69  UIBC ug/dL 729  TIBC 749 - 549 ug/dL 660  Saturation Ratios 10.4 - 31.8 % 20  Ferritin 11 - 307 ng/mL 59   ASSESSMENT & PLAN:  Assessment/Plan:  A 58 y.o. female with iron deficiency anemia.  I am pleased that her iron and hemoglobin remain at decent levels.  However, the major issue with this  patient is that she likely has metastatic cancer, as seen per recent scans done at [facility].  She is scheduled for a liver biopsy tomorrow.  She is scheduled to see me back in 6 months to reassess her iron deficiency anemia.  However, she knows she can also be evaluated by me much sooner if her liver biopsy shows metastatic cancer to where our office could give her palliative therapy for her disease management.  The patient understands all the plans discussed today and is in agreement with them.    Amarri Satterly DELENA Kerns, MD

## 2023-12-16 ENCOUNTER — Inpatient Hospital Stay: Admitting: Oncology

## 2023-12-16 ENCOUNTER — Inpatient Hospital Stay: Attending: Family Medicine

## 2023-12-17 ENCOUNTER — Telehealth: Payer: Self-pay | Admitting: Oncology

## 2023-12-17 NOTE — Telephone Encounter (Signed)
 Contacted pt to reschedule  missed appt from 12/16/23. Unable to reach via phone, voicemail was left.

## 2023-12-22 NOTE — Telephone Encounter (Signed)
 Contacted pt to reschedule missed appt from 12/16/23. Unable to reach via phone, voicemail was left.

## 2023-12-27 ENCOUNTER — Encounter: Payer: Self-pay | Admitting: Oncology

## 2024-01-10 NOTE — Progress Notes (Unsigned)
 [facility]  [address] [place],  [place]  [address] [phone]  Clinic Day:  01/11/2024  Referring physician: [facility]*   HISTORY OF PRESENT ILLNESS:  The patient is a 58 y.o. female who I follow for iron deficiency anemia.  She last received IV iron in May 2024.  She comes in today to reassess her hemoglobin.  The patient denies having any overt forms of blood loss over these past few months.   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  She has already completed 5 cycles of R-CHOP chemotherapy at [facility].  She is scheduled to receive her sixth and final cycle of R-CHOP chemotherapy next week.  A PET scan done on 09/11/2023 after 2 cycles showed an essentially complete response to therapy.    PHYSICAL EXAM:  Blood pressure 112/62, pulse (!) 121, temperature 99.3 F (37.4 C), temperature source Oral, resp. rate 18, height 5' 5 (1.651 m), weight 137 lb 6.4 oz (62.3 kg), SpO2 96%. Wt Readings from Last 3 Encounters:  01/11/24 137 lb 6.4 oz (62.3 kg)  09/06/23 159 lb 12.8 oz (72.5 kg)  08/16/23 155 lb (70.3 kg)   Body mass index is 22.86 kg/m. Performance status (ECOG): 0 - Asymptomatic Physical Exam Constitutional:      Appearance: Normal appearance. She is ill-appearing.     Comments: A chronically ill-appearing woman in a wheelchair, who is wearing oxygen per nasal cannula  HENT:     Mouth/Throat:     Mouth: Mucous membranes are moist.     Pharynx: Oropharynx is clear. No oropharyngeal exudate or posterior oropharyngeal erythema.  Cardiovascular:     Rate and Rhythm: Normal rate and regular rhythm.     Heart sounds: No murmur heard.    No friction rub. No gallop.  Pulmonary:     Effort: Pulmonary effort is normal. No respiratory distress.     Breath sounds: Decreased air movement present. Decreased breath  sounds present. No wheezing, rhonchi or rales.  Abdominal:     General: Bowel sounds are normal. There is no distension.     Palpations: Abdomen is soft. There is no mass.     Tenderness: There is no abdominal tenderness.  Musculoskeletal:        General: No swelling.     Right lower leg: No edema.     Left lower leg: No edema.  Lymphadenopathy:     Cervical: No cervical adenopathy.     Upper Body:     Right upper body: No supraclavicular or axillary adenopathy.     Left upper body: No supraclavicular or axillary adenopathy.     Lower Body: No right inguinal adenopathy. No left inguinal adenopathy.  Skin:    General: Skin is warm.     Coloration: Skin is not jaundiced.     Findings: No lesion or rash.  Neurological:     General: No focal deficit present.     Mental Status: She is alert and oriented to person, place, and time. Mental status is at baseline.  Psychiatric:        Mood and Affect: Mood normal.        Behavior: Behavior normal.        Thought Content: Thought content normal.   SCANS:  Her PET scan from 07-28-23 at [facility] revealed the following: IMPRESSION:  1. Multifocal hypermetabolic  liver lesions, concerning for metastatic  disease but poorly delineated in the absence of IV contrast. Consider  contrast-enhanced abdominal MRI and/or tissue sampling for further  evaluation.  2. Multifocal hypermetabolic left pleural nodularity/thickening,  concerning for additional sites of malignancy.  3.  Multifocal hypermetabolic groundglass opacities in the bilateral lungs,  concerning for multifocal malignancy given background severe emphysema,  evolution from remote prior exams, and the findings outlined in impressions  #1&2.   PATHOLOGY:  A left abdominal wall mass biopsy in March 2025 revealed the following: High-grade B-cell lymphoma. See comment.   Comment: The lymphoma cells have >80% proliferation fraction and relatively uniform morphology. The lack of CD10  and Myc staining rules out Burkitt lymphoma and CD34/TdT negativity ruled out a precursor B-cell neoplasm.  The immunophenotype is consistent with a GCB like lymphoma but a double expressor phenotype or evidence of EBV are not seen.    There is a diffuse infiltrate of small uniform round cells with fine chromatin and high nuclear cytoplasmic ratio.  Crush artifact is seen. The following immunohistochemistry was performed after review of the clinical history and morphology to further characterize the pathologic process. The results are as follows:   A1-13    CD3                   Highlights scattered background T-cells  A1-14    CD20                 Diffusely positive A1-15    KI-67                  Highlights increased proliferation index (80-90%)               A1-16    CD5                   Highlights scattered background T-cells A1-17    CD10                 negative  A1-18    BCL-2                diffusely positive A1-19    BCL-6                diffusely positive A1-20Cyclin D1negative A1-21CMYC IHChighlights less than 5% of cells A1-22    EBER                 negative A1-25    MUM-1               highlights 30% of cells A1-28    CD34                 Negative A1-29    TdT                    Negatove  LABS:      Latest Ref Rng & Units 01/11/2024   11:08 AM 08/16/2023    9:28 AM 03/25/2023    9:11 PM  CBC  WBC 4.0 - 10.5 K/uL 4.8  15.2  10.6   Hemoglobin 12.0 - 15.0 g/dL 89.8  87.8  87.0   Hematocrit 36.0 - 46.0 % 32.4  37.0  40.1   Platelets 150 - 400 K/uL 160  388  256       Latest Ref Rng & Units 01/11/2024   11:08 AM 03/25/2023    9:11 PM 04/22/2022    4:08 PM  CMP  Glucose 70 - 99 mg/dL 96  92  885   BUN 6 - 20 mg/dL 7  5  14    Creatinine 0.44 - 1.00 mg/dL 9.42  9.27  9.03   Sodium 135 - 145 mmol/L 143  141  142   Potassium 3.5 - 5.1 mmol/L 3.2  3.2  4.4   Chloride 98 - 111 mmol/L 103  102  97   CO2 22 - 32 mmol/L 28  29  29    Calcium  8.9 - 10.3 mg/dL 9.2  9.2  89.8    Total Protein 6.5 - 8.1 g/dL 6.4  6.2    Total Bilirubin 0.0 - 1.2 mg/dL <9.7  0.8    Alkaline Phos 38 - 126 U/L 126  93    AST 15 - 41 U/L 21  17    ALT 0 - 44 U/L 10  21      Latest Reference Range & Units 01/11/24 11:08  Iron 28 - 170 ug/dL 35  UIBC ug/dL 743  TIBC 749 - 549 ug/dL 708  Saturation Ratios 10.4 - 31.8 % 12  Ferritin 11 - 307 ng/mL 171    ASSESSMENT & PLAN:  Assessment/Plan:  A 58 y.o. female with iron deficiency anemia.  When evaluating her labs today, her hemoglobin is moderately low at 10.1.  However, as mentioned previously, this patient is currently undergoing R-CHOP chemotherapy to cure her diffuse large B-cell lymphoma.  Her anemia is likely related to this combination chemotherapy regimen.  Her iron parameters today are essentially not consistent with iron deficiency anemia being present.  I anticipate her hemoglobin will likely improve over time the  further that she gets out from her last cycle of R-CHOP chemotherapy.  For now, the patient knows to continue to follow with [facility] with respect to her management/surveillance of her diffuse large B cell.  I will tentatively see her back in 6 months to reassess her iron deficiency anemia.  The patient understands all the plans discussed today and is in agreement with them.    Kaamil Morefield DELENA Kerns, MD

## 2024-01-11 ENCOUNTER — Other Ambulatory Visit: Payer: Self-pay | Admitting: Oncology

## 2024-01-11 ENCOUNTER — Inpatient Hospital Stay (HOSPITAL_BASED_OUTPATIENT_CLINIC_OR_DEPARTMENT_OTHER): Admitting: Oncology

## 2024-01-11 ENCOUNTER — Inpatient Hospital Stay: Attending: Oncology

## 2024-01-11 ENCOUNTER — Telehealth: Payer: Self-pay | Admitting: Oncology

## 2024-01-11 VITALS — BP 112/62 | HR 121 | Temp 99.3°F | Resp 18 | Ht 65.0 in | Wt 137.4 lb

## 2024-01-11 DIAGNOSIS — D509 Iron deficiency anemia, unspecified: Secondary | ICD-10-CM | POA: Diagnosis present

## 2024-01-11 DIAGNOSIS — D500 Iron deficiency anemia secondary to blood loss (chronic): Secondary | ICD-10-CM

## 2024-01-11 DIAGNOSIS — C83398 Diffuse large b-cell lymphoma of other extranodal and solid organ sites: Secondary | ICD-10-CM | POA: Insufficient documentation

## 2024-01-11 DIAGNOSIS — C8338 Diffuse large B-cell lymphoma, lymph nodes of multiple sites: Secondary | ICD-10-CM

## 2024-01-11 LAB — CMP (CANCER CENTER ONLY)
ALT: 10 U/L (ref 0–44)
AST: 21 U/L (ref 15–41)
Albumin: 4 g/dL (ref 3.5–5.0)
Alkaline Phosphatase: 126 U/L (ref 38–126)
Anion gap: 12 (ref 5–15)
BUN: 7 mg/dL (ref 6–20)
CO2: 28 mmol/L (ref 22–32)
Calcium: 9.2 mg/dL (ref 8.9–10.3)
Chloride: 103 mmol/L (ref 98–111)
Creatinine: 0.57 mg/dL (ref 0.44–1.00)
GFR, Estimated: >60 mL/min (ref >60)
Glucose, Bld: 96 mg/dL (ref 70–99)
Potassium: 3.2 mmol/L — ABNORMAL LOW (ref 3.5–5.1)
Sodium: 143 mmol/L (ref 135–145)
Total Bilirubin: <0.2 mg/dL (ref 0.0–1.2)
Total Protein: 6.4 g/dL — ABNORMAL LOW (ref 6.5–8.1)

## 2024-01-11 LAB — CBC WITH DIFFERENTIAL (CANCER CENTER ONLY)
Abs Immature Granulocytes: 0.16 K/uL — ABNORMAL HIGH (ref 0.00–0.07)
Basophils Absolute: 0.1 K/uL (ref 0.0–0.1)
Basophils Relative: 2 %
Eosinophils Absolute: 0.1 K/uL (ref 0.0–0.5)
Eosinophils Relative: 3 %
HCT: 32.4 % — ABNORMAL LOW (ref 36.0–46.0)
Hemoglobin: 10.1 g/dL — ABNORMAL LOW (ref 12.0–15.0)
Immature Granulocytes: 3 %
Lymphocytes Relative: 8 %
Lymphs Abs: 0.4 K/uL — ABNORMAL LOW (ref 0.7–4.0)
MCH: 28.9 pg (ref 26.0–34.0)
MCHC: 31.2 g/dL (ref 30.0–36.0)
MCV: 92.8 fL (ref 80.0–100.0)
Monocytes Absolute: 0.4 K/uL (ref 0.1–1.0)
Monocytes Relative: 9 %
Neutro Abs: 3.6 K/uL (ref 1.7–7.7)
Neutrophils Relative %: 75 %
Platelet Count: 160 K/uL (ref 150–400)
RBC: 3.49 MIL/uL — ABNORMAL LOW (ref 3.87–5.11)
RDW: 14.6 % (ref 11.5–15.5)
Smear Review: NORMAL
WBC Count: 4.8 K/uL (ref 4.0–10.5)
nRBC: 0 % (ref 0.0–0.2)

## 2024-01-11 LAB — FERRITIN: Ferritin: 171 ng/mL (ref 11–307)

## 2024-01-11 LAB — LACTATE DEHYDROGENASE: LDH: 195 U/L — ABNORMAL HIGH (ref 98–192)

## 2024-01-11 LAB — IRON AND TIBC
Iron: 35 ug/dL (ref 28–170)
Saturation Ratios: 12 % (ref 10.4–31.8)
TIBC: 291 ug/dL (ref 250–450)
UIBC: 256 ug/dL

## 2024-01-11 NOTE — Telephone Encounter (Signed)
 Patient has been scheduled for follow-up visit per 01/07/24 LOS.  Pt aware of scheduled appt details.

## 2024-02-18 ENCOUNTER — Encounter: Payer: Self-pay | Admitting: Cardiology

## 2024-07-12 ENCOUNTER — Other Ambulatory Visit: Payer: Self-pay | Admitting: Oncology

## 2024-07-12 DIAGNOSIS — C8338 Diffuse large B-cell lymphoma, lymph nodes of multiple sites: Secondary | ICD-10-CM

## 2024-07-12 DIAGNOSIS — D500 Iron deficiency anemia secondary to blood loss (chronic): Secondary | ICD-10-CM

## 2024-07-12 NOTE — Progress Notes (Unsigned)
 " [facility]  [address] [place],  [place]  [address] [phone]  Clinic Day:  01/11/2024  Referring physician: [facility]   HISTORY OF PRESENT ILLNESS:  The patient is a 59 y.o. female who I follow for iron deficiency anemia.  She last received IV iron in May 2024.  She comes in today to reassess her hemoglobin.  The patient denies having any overt forms of blood loss over these past few months.   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  She completed 6 cycles of R-CHOP chemotherapy in August 2025 at [facility]    Her PET scan in October 2025 showed a complete response.  However, a recent PET scan in late January 2026 revealed  Hypermetabolic flank lesions, worrisome for early disease recurrence.   PHYSICAL EXAM:  There were no vitals taken for this visit. Wt Readings from Last 3 Encounters:  01/11/24 137 lb 6.4 oz (62.3 kg)  09/06/23 159 lb 12.8 oz (72.5 kg)  08/16/23 155 lb (70.3 kg)   There is no height or weight on file to calculate BMI. Performance status (ECOG): 0 - Asymptomatic Physical Exam Constitutional:      Appearance: Normal appearance. She is ill-appearing.     Comments: A chronically ill-appearing woman in a wheelchair, who is wearing oxygen per nasal cannula  HENT:     Mouth/Throat:     Mouth: Mucous membranes are moist.     Pharynx: Oropharynx is clear. No oropharyngeal exudate or posterior oropharyngeal erythema.  Cardiovascular:     Rate and Rhythm: Normal rate and regular rhythm.     Heart sounds: No murmur heard.    No friction rub. No gallop.  Pulmonary:     Effort: Pulmonary effort is normal. No respiratory distress.     Breath sounds: Decreased air movement present. Decreased breath sounds present. No wheezing, rhonchi or rales.  Abdominal:     General:  Bowel sounds are normal. There is no distension.     Palpations: Abdomen is soft. There is no mass.     Tenderness: There is no abdominal tenderness.  Musculoskeletal:        General: No swelling.     Right lower leg: No edema.     Left lower leg: No edema.  Lymphadenopathy:     Cervical: No cervical adenopathy.     Upper Body:     Right upper body: No supraclavicular or axillary adenopathy.     Left upper body: No supraclavicular or axillary adenopathy.     Lower Body: No right inguinal adenopathy. No left inguinal adenopathy.  Skin:    General: Skin is warm.     Coloration: Skin is not jaundiced.     Findings: No lesion or rash.  Neurological:     General: No focal deficit present.     Mental Status: She is alert and oriented to person, place, and time. Mental status is at baseline.  Psychiatric:        Mood and Affect: Mood normal.        Behavior: Behavior normal.        Thought Content: Thought content normal.   SCANS:  Her PET scan from 07-07-24 at [facility] revealed the following: FINDINGS:   Visualized Head & Neck:  - Lymph Nodes: No hypermetabolic cervical lymphadenopathy.  - Thyroid:  No suspicious tracer uptake.  - Other: No suspicious tracer uptake.   Chest:  - Lymph Nodes: No hypermetabolic axillary, mediastinal, or hilar lymphadenopathy.  - Lungs & Pleura: No suspicious tracer uptake. No pulmonary consolidation, concerning nodules or significant effusions. Severe centrilobular and paraseptal emphysema.  - Heart & Pericardium: No suspicious tracer uptake. Right chest wall port with tip terminating in the superior right atrium. Mild coronary cusp locations.   Abdomen & Pelvis:  - Lymph Nodes: No hypermetabolic abdominal, pelvic, mesenteric, or inguinal lymphadenopathy.  - Liver:  No focal hypermetabolic activity above background liver.  - Biliary & Gallbladder: No suspicious tracer uptake.  - Spleen: Normal size. No focal hypermetabolic lesions. Normal uptake  relative to liver  - Pancreas: No suspicious tracer uptake.  - Adrenal Glands: No suspicious tracer uptake.  - Kidneys: Normal urinary excretion without hydronephrosis. No hypermetabolic masses.  - Genitourinary: No suspicious tracer uptake.  - Vasculature: No suspicious tracer uptake.  - Gastrointestinal: Physiologic uptake without suspicious focal uptake.  - Peritoneum: No suspicious tracer uptake.   Musculoskeletal:  - Bones: No suspicious osseous lesions.  - Soft Tissues: There are 3 focal areas of intense FDG uptake within the bilateral flank soft tissue.   IMPRESSION:  New small areas of soft tissue with intense FDG activity within the bilateral flanks worrisome for recurrent high-grade lymphoma ([place] 5).      PATHOLOGY:  A left abdominal wall mass biopsy in March 2025 revealed the following: High-grade B-cell lymphoma. See comment.   Comment: The lymphoma cells have >80% proliferation fraction and relatively uniform morphology. The lack of CD10 and Myc staining rules out Burkitt lymphoma and CD34/TdT negativity ruled out a precursor B-cell neoplasm.  The immunophenotype is consistent with a GCB like lymphoma but a double expressor phenotype or evidence of EBV are not seen.    There is a diffuse infiltrate of small uniform round cells with fine chromatin and high nuclear cytoplasmic ratio.  Crush artifact is seen. The following immunohistochemistry was performed after review of the clinical history and morphology to further characterize the pathologic process. The results are as follows:   A1-13    CD3                   Highlights scattered background T-cells  A1-14    CD20                 Diffusely positive A1-15    KI-67                  Highlights increased proliferation index (80-90%)               A1-16    CD5                   Highlights scattered background T-cells A1-17    CD10                 negative  A1-18    BCL-2                diffusely positive A1-19     BCL-6                diffusely positive A1-20Cyclin D1negative A1-21CMYC IHChighlights less than 5% of cells A1-22    EBER                 negative A1-25    MUM-1  highlights 30% of cells A1-28    CD34                 Negative A1-29    TdT                    Negatove  LABS:      Latest Ref Rng & Units 01/11/2024   11:08 AM 08/16/2023    9:28 AM 03/25/2023    9:11 PM  CBC  WBC 4.0 - 10.5 K/uL 4.8  15.2  10.6   Hemoglobin 12.0 - 15.0 g/dL 89.8  87.8  87.0   Hematocrit 36.0 - 46.0 % 32.4  37.0  40.1   Platelets 150 - 400 K/uL 160  388  256       Latest Ref Rng & Units 01/11/2024   11:08 AM 03/25/2023    9:11 PM 04/22/2022    4:08 PM  CMP  Glucose 70 - 99 mg/dL 96  92  885   BUN 6 - 20 mg/dL 7  5  14    Creatinine 0.44 - 1.00 mg/dL 9.42  9.27  9.03   Sodium 135 - 145 mmol/L 143  141  142   Potassium 3.5 - 5.1 mmol/L 3.2  3.2  4.4   Chloride 98 - 111 mmol/L 103  102  97   CO2 22 - 32 mmol/L 28  29  29    Calcium  8.9 - 10.3 mg/dL 9.2  9.2  89.8   Total Protein 6.5 - 8.1 g/dL 6.4  6.2    Total Bilirubin 0.0 - 1.2 mg/dL <9.7  0.8    Alkaline Phos 38 - 126 U/L 126  93    AST 15 - 41 U/L 21  17    ALT 0 - 44 U/L 10  21      Latest Reference Range & Units 01/11/24 11:08  Iron 28 - 170 ug/dL 35  UIBC ug/dL 743  TIBC 749 - 549 ug/dL 708  Saturation Ratios 10.4 - 31.8 % 12  Ferritin 11 - 307 ng/mL 171    ASSESSMENT & PLAN:  Assessment/Plan:  A 59 y.o. female with iron deficiency anemia.  When evaluating her labs today, her hemoglobin is moderately low at 10.1.  However, as mentioned previously, this patient is currently undergoing R-CHOP chemotherapy to cure her diffuse large B-cell lymphoma.  Her anemia is likely related to this combination chemotherapy regimen.  Her iron parameters today are essentially not consistent with iron deficiency anemia being present.  I anticipate her hemoglobin will likely improve over time the  further that she gets out from her last cycle of  R-CHOP chemotherapy.  For now, the patient knows to continue to follow with [facility] with respect to her management/surveillance of her diffuse large B cell.  I will tentatively see her back in 6 months to reassess her iron deficiency anemia.  The patient understands all the plans discussed today and is in agreement with them.    Nayelis Bonito DELENA Kerns, MD       "

## 2024-07-13 ENCOUNTER — Inpatient Hospital Stay

## 2024-07-13 ENCOUNTER — Inpatient Hospital Stay: Admitting: Oncology
